# Patient Record
Sex: Male | Born: 1995 | Race: Black or African American | Hispanic: No | Marital: Single | Smoking: Never smoker
Health system: Southern US, Community
[De-identification: ages and names within clinical notes are randomized; demographics above are authoritative.]

## PROBLEM LIST (undated history)

## (undated) DIAGNOSIS — J189 Pneumonia, unspecified organism: Secondary | ICD-10-CM

## (undated) DIAGNOSIS — J45909 Unspecified asthma, uncomplicated: Secondary | ICD-10-CM

## (undated) DIAGNOSIS — D571 Sickle-cell disease without crisis: Secondary | ICD-10-CM

## (undated) DIAGNOSIS — H539 Unspecified visual disturbance: Secondary | ICD-10-CM

## (undated) DIAGNOSIS — J302 Other seasonal allergic rhinitis: Secondary | ICD-10-CM

## (undated) DIAGNOSIS — R51 Headache: Secondary | ICD-10-CM

## (undated) HISTORY — DX: Other seasonal allergic rhinitis: J30.2

---
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
## 2017-01-04 ENCOUNTER — Ambulatory Visit (INDEPENDENT_AMBULATORY_CARE_PROVIDER_SITE_OTHER): Payer: Medicaid Other | Admitting: Family Medicine

## 2017-01-04 ENCOUNTER — Encounter: Payer: Self-pay | Admitting: Family Medicine

## 2017-01-04 VITALS — BP 128/60 | HR 84 | Temp 98.2°F | Resp 16 | Ht 68.0 in | Wt 156.0 lb

## 2017-01-04 DIAGNOSIS — R21 Rash and other nonspecific skin eruption: Secondary | ICD-10-CM

## 2017-01-04 DIAGNOSIS — Z7689 Persons encountering health services in other specified circumstances: Secondary | ICD-10-CM

## 2017-01-04 DIAGNOSIS — Z113 Encounter for screening for infections with a predominantly sexual mode of transmission: Secondary | ICD-10-CM

## 2017-01-04 DIAGNOSIS — D571 Sickle-cell disease without crisis: Secondary | ICD-10-CM | POA: Diagnosis not present

## 2017-01-04 LAB — COMPLETE METABOLIC PANEL WITH GFR
ALT: 25 U/L (ref 9–46)
AST: 34 U/L (ref 10–40)
Albumin: 4.3 g/dL (ref 3.6–5.1)
Alkaline Phosphatase: 94 U/L (ref 40–115)
BUN: 8 mg/dL (ref 7–25)
CO2: 22 mmol/L (ref 20–31)
CREATININE: 0.68 mg/dL (ref 0.60–1.35)
Calcium: 9.4 mg/dL (ref 8.6–10.3)
Chloride: 104 mmol/L (ref 98–110)
GFR, Est African American: >89 mL/min (ref >=60)
GFR, Est Non African American: >89 mL/min (ref >=60)
Glucose, Bld: 91 mg/dL (ref 65–99)
Potassium: 4.2 mmol/L (ref 3.5–5.3)
SODIUM: 137 mmol/L (ref 135–146)
Total Bilirubin: 1.2 mg/dL (ref 0.2–1.2)
Total Protein: 7.7 g/dL (ref 6.1–8.1)

## 2017-01-04 LAB — CBC WITH DIFFERENTIAL/PLATELET
Basophils Absolute: 77 cells/uL (ref 0–200)
Basophils Relative: 1 %
EOS ABS: 385 {cells}/uL (ref 15–500)
Eosinophils Relative: 5 %
HEMATOCRIT: 26.2 % — AB (ref 38.5–50.0)
Hemoglobin: 7.9 g/dL — ABNORMAL LOW (ref 13.2–17.1)
LYMPHS PCT: 47 %
Lymphs Abs: 3619 cells/uL (ref 850–3900)
MCH: 22.6 pg — ABNORMAL LOW (ref 27.0–33.0)
MCHC: 30.2 g/dL — ABNORMAL LOW (ref 32.0–36.0)
MCV: 74.9 fL — ABNORMAL LOW (ref 80.0–100.0)
MPV: 9.2 fL (ref 7.5–12.5)
Monocytes Absolute: 1001 cells/uL — ABNORMAL HIGH (ref 200–950)
Monocytes Relative: 13 %
Neutro Abs: 2618 cells/uL (ref 1500–7800)
Neutrophils Relative %: 34 %
Platelets: 638 10*3/uL — ABNORMAL HIGH (ref 140–400)
RBC: 3.5 MIL/uL — ABNORMAL LOW (ref 4.20–5.80)
RDW: 22.1 % — AB (ref 11.0–15.0)
WBC: 7.7 10*3/uL (ref 3.8–10.8)

## 2017-01-04 LAB — RETICULOCYTES
ABS RETIC: 178500 {cells}/uL — AB (ref 25000–90000)
RBC.: 3.5 MIL/uL — ABNORMAL LOW (ref 4.20–5.80)
Retic Ct Pct: 5.1 %

## 2017-01-04 LAB — POCT URINALYSIS DIP (DEVICE)
Bilirubin Urine: NEGATIVE
Glucose, UA: NEGATIVE mg/dL
HGB URINE DIPSTICK: NEGATIVE
Ketones, ur: NEGATIVE mg/dL
LEUKOCYTES UA: NEGATIVE
NITRITE: NEGATIVE
Protein, ur: NEGATIVE mg/dL
Specific Gravity, Urine: 1.015 (ref 1.005–1.030)
UROBILINOGEN UA: 1 mg/dL (ref 0.0–1.0)
pH: 6.5 (ref 5.0–8.0)

## 2017-01-04 MED ORDER — TRIAMCINOLONE ACETONIDE 0.1 % EX CREA: 1.0000 "application " | TOPICAL_CREAM | Freq: Two times a day (BID) | CUTANEOUS | 0 refills | Status: DC

## 2017-01-04 NOTE — Patient Instructions (Signed)
Apply triamcinolone cream to rash which appears to be eczema. If rash worsens or doesn't improve, return for care.   Sickle Cell Anemia, Adult Sickle cell anemia is a condition where your red blood cells are shaped like sickles. Red blood cells carry oxygen through the body. Sickle-shaped red blood cells do not live as long as normal red blood cells. They also clump together and block blood from flowing through the blood vessels. These things prevent the body from getting enough oxygen. Sickle cell anemia causes organ damage and pain. It also increases the risk of infection. Follow these instructions at home:  Drink enough fluid to keep your pee (urine) clear or pale yellow. Drink more in hot weather and during exercise.  Do not smoke. Smoking lowers oxygen levels in the blood.  Only take over-the-counter or prescription medicines as told by your doctor.  Take antibiotic medicines as told by your doctor. Make sure you finish them even if you start to feel better.  Take supplements as told by your doctor.  Consider wearing a medical alert bracelet. This tells anyone caring for you in an emergency of your condition.  When traveling, keep your medical information, doctors' names, and the medicines you take with you at all times.  If you have a fever, do not take fever medicines right away. This could cover up a problem. Tell your doctor.  Keep all follow-up visits with your doctor. Sickle cell anemia requires regular medical care. Contact a doctor if: You have a fever. Get help right away if:  You feel dizzy or faint.  You have new belly (abdominal) pain, especially on the left side near the stomach area.  You have a lasting, often uncomfortable and painful erection of the penis (priapism). If it is not treated right away, you will become unable to have sex (impotence).  You have numbness in your arms or legs or you have a hard time moving them.  You have a hard time talking.  You  have a fever or lasting symptoms for more than 2-3 days.  You have a fever and your symptoms suddenly get worse.  You have signs or symptoms of infection. These include: ? Chills. ? Being more tired than normal (lethargy). ? Irritability. ? Poor eating. ? Throwing up (vomiting).  You have pain that is not helped with medicine.  You have shortness of breath.  You have pain in your chest.  You are coughing up pus-like or bloody mucus.  You have a stiff neck.  Your feet or hands swell or have pain.  Your belly looks bloated.  Your joints hurt. This information is not intended to replace advice given to you by your health care provider. Make sure you discuss any questions you have with your health care provider. Document Released: 03/22/2013 Document Revised: 11/07/2015 Document Reviewed: 01/11/2013 Elsevier Interactive Patient Education  2017 Elsevier Inc.    Eczema Eczema, also called atopic dermatitis, is a skin disorder that causes inflammation of the skin. It causes a red rash and dry, scaly skin. The skin becomes very itchy. Eczema is generally worse during the cooler winter months and often improves with the warmth of summer. Eczema usually starts showing signs in infancy. Some children outgrow eczema, but it may last through adulthood. What are the causes? The exact cause of eczema is not known, but it appears to run in families. People with eczema often have a family history of eczema, allergies, asthma, or hay fever. Eczema is not contagious.  Flare-ups of the condition may be caused by:  Contact with something you are sensitive or allergic to.  Stress.  What are the signs or symptoms?  Dry, scaly skin.  Red, itchy rash.  Itchiness. This may occur before the skin rash and may be very intense. How is this diagnosed? The diagnosis of eczema is usually made based on symptoms and medical history. How is this treated? Eczema cannot be cured, but symptoms usually  can be controlled with treatment and other strategies. A treatment plan might include:  Controlling the itching and scratching. ? Use over-the-counter antihistamines as directed for itching. This is especially useful at night when the itching tends to be worse. ? Use over-the-counter steroid creams as directed for itching. ? Avoid scratching. Scratching makes the rash and itching worse. It may also result in a skin infection (impetigo) due to a break in the skin caused by scratching.  Keeping the skin well moisturized with creams every day. This will seal in moisture and help prevent dryness. Lotions that contain alcohol and water should be avoided because they can dry the skin.  Limiting exposure to things that you are sensitive or allergic to (allergens).  Recognizing situations that cause stress.  Developing a plan to manage stress.  Follow these instructions at home:  Only take over-the-counter or prescription medicines as directed by your health care provider.  Do not use anything on the skin without checking with your health care provider.  Keep baths or showers short (5 minutes) in warm (not hot) water. Use mild cleansers for bathing. These should be unscented. You may add nonperfumed bath oil to the bath water. It is best to avoid soap and bubble bath.  Immediately after a bath or shower, when the skin is still damp, apply a moisturizing ointment to the entire body. This ointment should be a petroleum ointment. This will seal in moisture and help prevent dryness. The thicker the ointment, the better. These should be unscented.  Keep fingernails cut short. Children with eczema may need to wear soft gloves or mittens at night after applying an ointment.  Dress in clothes made of cotton or cotton blends. Dress lightly, because heat increases itching.  A child with eczema should stay away from anyone with fever blisters or cold sores. The virus that causes fever blisters (herpes  simplex) can cause a serious skin infection in children with eczema. Contact a health care provider if:  Your itching interferes with sleep.  Your rash gets worse or is not better within 1 week after starting treatment.  You see pus or soft yellow scabs in the rash area.  You have a fever.  You have a rash flare-up after contact with someone who has fever blisters. This information is not intended to replace advice given to you by your health care provider. Make sure you discuss any questions you have with your health care provider. Document Released: 05/29/2000 Document Revised: 11/07/2015 Document Reviewed: 01/02/2013 Elsevier Interactive Patient Education  2017 [employer]

## 2017-01-04 NOTE — Progress Notes (Signed)
Patient ID: Derrick Wu, male    DOB: 1996-03-04, 21 y.o.   MRN: [id number]  PCP: Derrick NeighborsHarris, Shanetta Nicolls S, FNP  Chief Complaint  Patient presents with  . Establish Care  . Rash    On both legs    Subjective:  HPI Derrick Wu is a [age] y.o. male presents for evaluation of bilateral rash of both legs. Medical problems: Sickle Cell Disease and Asthma  Derrick Wu was [facility] the [facility] by the [facility]. 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 He doesn't take any chronic pain medication and reports that he almost never experiences any form of pain crisis. He takes ibuprofen for generalized pain when occurs and reports relief with this regimen at present. He is followed at [facility] and reports that his last visit was little over 1 year ago. His baseline hemoglobin averages 7.0-7.9. He is current with his immunizations. His only complaint today is a  rash on the top of his knees bilaterally. The rash originally developed over a few months ago was treated with hydrocortisone cream. The rash improved although never completely went away. Rash is dry, patchy, with lighten skin color. The rash does not itch.  He denies any recent changes in detergents, foods, soaps, or lotion.  Derrick Wu requests STD testing today. He is sexually active, however denies any genital lesions, dysuria, or penile discharge. Social History   Social History  . Marital status: Single    Spouse name: N/A  . Number of children: N/A  . Years of education: N/A   Occupational History  . Not on file.   Social History Main Topics  . Smoking status: Never Smoker  . Smokeless tobacco: Never Used  . Alcohol use No  . Drug use: No  . Sexual activity: No   Other Topics Concern  . Not on file   Social History Narrative   Pt is in 11th grade 2014-15    Family History  Problem Relation Age of Onset  . Diabetes Mother   . Hypertension Mother   . Vision loss  Mother   . Diabetes Father   . Hypertension Father   . Vision loss Father    Review of Systems  See HPI  Prior to Admission medications   Medication Sig Start Date End Date Taking? Authorizing Provider  ibuprofen (ADVIL,MOTRIN) 400 MG tablet Take 1 tablet (400 mg total) by mouth every 6 (six) hours as needed. 05/12/13  Yes Kirichenko, Tatyana, PA-C  albuterol (PROVENTIL HFA;VENTOLIN HFA) 108 (90 BASE) MCG/ACT inhaler Inhale 1-2 puffs into the lungs every 6 (six) hours as needed for wheezing or shortness of breath. Patient not taking: Reported on 01/04/2017 04/16/14   Derrick MoGentry, Matthew, MD  guaifenesin (ROBITUSSIN) 100 MG/5ML syrup Take 5-10 mLs (100-200 mg total) by mouth every 4 (four) hours as needed for cough. Patient not taking: Reported on 01/04/2017 05/12/13   Derrick CrumbleKirichenko, Tatyana, PA-C  HYDROcodone-acetaminophen (NORCO) 5-325 MG per tablet Take 1 tablet by mouth every 6 (six) hours as needed for moderate pain. Patient not taking: Reported on 01/04/2017 05/26/13   Derrick Wu, DDS  sodium chloride (OCEAN) 0.65 % SOLN nasal spray Place 1 spray into both nostrils as needed for congestion. Patient not taking: Reported on 01/04/2017 05/12/13   Derrick CrumbleKirichenko, Tatyana, PA-C    Past Medical, Surgical Family and Social History reviewed and updated.    Objective:   Today'Wu Vitals   01/04/17 1410  BP: 128/60  Pulse: 84  Resp: 16  Temp: 98.2 F (36.8 C)  TempSrc: Oral  SpO2: 96%  Weight: 156 lb (70.8 kg)  Height: 5\' 8"  (1.727 m)    Wt Readings from Last 3 Encounters:  01/04/17 156 lb (70.8 kg)  04/16/14 138 lb 0.1 oz (62.6 kg) (37 %, Z= -0.34)*  05/26/13 118 lb 13.3 oz (53.9 kg) (15 %, Z= -1.04)*   * Growth percentiles are based on CDC 2-20 Years data.   Physical Exam  Constitutional: He is oriented to person, place, and time. He appears well-developed and well-nourished.  HENT:  Head: Normocephalic and atraumatic.  Right Ear: External ear normal.  Left Ear: External ear normal.   Nose: Nose normal.  Mouth/Throat: Oropharynx is clear and moist.  Eyes: Pupils are equal, round, and reactive to light. Conjunctivae are normal. No scleral icterus.  Neck: Normal range of motion. Neck supple.  Cardiovascular: Normal rate, regular rhythm, normal heart sounds and intact distal pulses.   Pulmonary/Chest: Effort normal and breath sounds normal.  Abdominal: Soft. Bowel sounds are normal.  Neurological: He is alert and oriented to person, place, and time.  Skin: Skin is warm and dry. Rash noted.  Fine,  Macular, dry, scaly rash bilateral lower thigh above the knees.  Psychiatric: He has a normal mood and affect. His behavior is normal. Judgment and thought content normal.   Assessment & Plan:  1. Hb-SS disease without crisis (HCC), stable - COMPLETE METABOLIC PANEL WITH GFR - CBC with Differential - Reticulocytes -Schedule a follow-up with [facility] for routine annual follow-up.  2. Encounter to establish care   3. Routine screening for STI (sexually transmitted infection) - HIV antibody (with reflex) - RPR - HSV(herpes simplex vrs) 1+2 ab-IgG - GC/Chlamydia Probe Amp  4. Rash and nonspecific skin eruption -Apply triamcinolone cream to affected area, twice daily   RTC: 3 months for routine wellness evaluation, influenza vaccine, and any other outstanding health maintenance items.  Godfrey Pick. Tiburcio Pea, MSN, FNP-C [facility]  [address] [place], [place] [address] [phone]

## 2017-01-05 LAB — HSV(HERPES SIMPLEX VRS) I + II AB-IGG
HSV 1 Glycoprotein G Ab, IgG: 21.1 Index — ABNORMAL HIGH (ref <0.90)
HSV 2 Glycoprotein G Ab, IgG: <0.9 Index (ref <0.90)

## 2017-01-05 LAB — HIV ANTIBODY (ROUTINE TESTING W REFLEX): HIV 1&2 Ab, 4th Generation: NONREACTIVE

## 2017-01-05 LAB — RPR

## 2017-01-06 ENCOUNTER — Telehealth: Payer: Self-pay

## 2017-01-06 LAB — GC/CHLAMYDIA PROBE AMP

## 2017-01-07 DIAGNOSIS — D571 Sickle-cell disease without crisis: Secondary | ICD-10-CM | POA: Insufficient documentation

## 2017-01-07 DIAGNOSIS — R21 Rash and other nonspecific skin eruption: Secondary | ICD-10-CM | POA: Insufficient documentation

## 2017-03-16 ENCOUNTER — Ambulatory Visit: Payer: Medicaid Other | Admitting: Family Medicine

## 2017-09-03 NOTE — Telephone Encounter (Signed)
Message not needed. °

## 2018-05-30 ENCOUNTER — Ambulatory Visit (INDEPENDENT_AMBULATORY_CARE_PROVIDER_SITE_OTHER): Payer: Medicaid Other | Admitting: Family Medicine

## 2018-05-30 ENCOUNTER — Encounter: Payer: Self-pay | Admitting: Family Medicine

## 2018-05-30 ENCOUNTER — Ambulatory Visit (HOSPITAL_COMMUNITY)
Admission: RE | Admit: 2018-05-30 | Discharge: 2018-05-30 | Disposition: A | Discharge status: Home / Self Care | Payer: Medicaid Other | Source: Ambulatory Visit | Attending: Family Medicine | Admitting: Family Medicine

## 2018-05-30 VITALS — BP 124/88 | HR 84 | Ht 68.0 in | Wt 152.0 lb

## 2018-05-30 DIAGNOSIS — S6991XA Unspecified injury of right wrist, hand and finger(s), initial encounter: Secondary | ICD-10-CM | POA: Diagnosis not present

## 2018-05-30 DIAGNOSIS — N390 Urinary tract infection, site not specified: Secondary | ICD-10-CM

## 2018-05-30 DIAGNOSIS — D571 Sickle-cell disease without crisis: Secondary | ICD-10-CM | POA: Diagnosis not present

## 2018-05-30 DIAGNOSIS — Z113 Encounter for screening for infections with a predominantly sexual mode of transmission: Secondary | ICD-10-CM

## 2018-05-30 DIAGNOSIS — Z09 Encounter for follow-up examination after completed treatment for conditions other than malignant neoplasm: Secondary | ICD-10-CM

## 2018-05-30 DIAGNOSIS — Z7689 Persons encountering health services in other specified circumstances: Secondary | ICD-10-CM | POA: Diagnosis not present

## 2018-05-30 DIAGNOSIS — Z0000 Encounter for general adult medical examination without abnormal findings: Secondary | ICD-10-CM

## 2018-05-30 LAB — POCT URINALYSIS DIP (MANUAL ENTRY)
Bilirubin, UA: NEGATIVE
Blood, UA: NEGATIVE
Glucose, UA: NEGATIVE mg/dL
Ketones, POC UA: NEGATIVE mg/dL
Nitrite, UA: NEGATIVE
Protein Ur, POC: NEGATIVE mg/dL
Spec Grav, UA: 1.015 (ref 1.010–1.025)
Urobilinogen, UA: 1 E.U./dL
pH, UA: 6 (ref 5.0–8.0)

## 2018-05-30 MED ORDER — SULFAMETHOXAZOLE-TRIMETHOPRIM 800-160 MG PO TABS: 1.0000 | ORAL_TABLET | Freq: Two times a day (BID) | ORAL | 0 refills | Status: DC

## 2018-05-30 NOTE — Progress Notes (Signed)
Sickle Cell Anemia--Re-establish Care  Subjective:    Patient ID: Derrick Wu, male    DOB: [birth year], 22 y.o.   MRN: [id number]  Chief Complaint  Patient presents with  . Follow-up    Sickle cell  . Hand Pain    right   HPI  Derrick Wu is a 22 year old male with a past medical history of Sickle Cell Anemia, Pneumonia, Headaches, and Asthma. He is here today to re-establish care.   Current Status: Since his last office visit, he is doing well with no complaints. He states that he has pain in his feet, hands, and back. He rates his pain today at 0/10. He has not has a hospital visit for Sickle Cell Crisis since 06/27/2017 where he was treated and discharged the same day. He is currently taking all medications as prescribed and staying well hydrated. He is only taking Motrin 800 mg for pain. He reports occasional dizziness and headaches. He is followed by Hematologist at [facility]. He had a recent right hand injury by hitting his hand on a wall a week ago.   He denies fevers, chills, fatigue, recent infections, weight loss, and night sweats. He has not had any visual changes, and falls. No chest pain, heart palpitations, cough and shortness of breath reported. No reports of GI problems such as nausea, vomiting, diarrhea, and constipation. He has no reports of blood in stools, dysuria and hematuria. No depression or anxiety reported. He denies pain today.   Review of Systems  Constitutional: Negative.   HENT: Negative.   Eyes: Negative.   Respiratory: Negative.   Cardiovascular: Negative.   Gastrointestinal: Negative.   Endocrine: Negative.   Genitourinary: Negative.   Musculoskeletal: Negative.   Skin: Negative.   Allergic/Immunologic: Negative.   Neurological: Positive for dizziness and headaches.  Hematological: Negative.   Psychiatric/Behavioral: Negative.    Objective:   Physical Exam Vitals signs and nursing note reviewed.  Constitutional:      Appearance: Normal  appearance. He is normal weight.  HENT:     Head: Normocephalic and atraumatic.     Right Ear: Tympanic membrane, ear canal and external ear normal.     Left Ear: Tympanic membrane, ear canal and external ear normal.     Nose: Nose normal.     Mouth/Throat:     Mouth: Mucous membranes are moist.     Pharynx: Oropharynx is clear.  Eyes:     Conjunctiva/sclera: Conjunctivae normal.     Pupils: Pupils are equal, round, and reactive to light.  Neck:     Musculoskeletal: Normal range of motion and neck supple.  Cardiovascular:     Rate and Rhythm: Normal rate and regular rhythm.     Pulses: Normal pulses.     Heart sounds: Normal heart sounds.  Pulmonary:     Effort: Pulmonary effort is normal.     Breath sounds: Normal breath sounds.  Abdominal:     General: Abdomen is flat. Bowel sounds are normal.     Palpations: Abdomen is soft.  Musculoskeletal: Normal range of motion.  Skin:    General: Skin is warm and dry.  Neurological:     Mental Status: He is alert.  Psychiatric:        Mood and Affect: Mood normal.        Behavior: Behavior normal.        Thought Content: Thought content normal.        Judgment: Judgment normal.    Assessment &  Plan:   1. Encounter to establish care  2. Hb-SS disease without crisis [facility](HCC) He is doing well today. He will continue to take pain medications as prescribed; will continue to avoid extreme heat and cold; will continue to eat a healthy diet and drink at least 64 ounces of water daily; continue stool softener as needed; will avoid colds and flu; will continue to get plenty of sleep and rest; will continue to avoid high stressful situations and remain infection free; will continue Folic Acid 1 mg daily to avoid sickle cell crisis.   3. Hand injury, right, initial encounter We will get X-ray today.  - DG Hand Complete Right; Future  4. Routine screening for STI (sexually transmitted infection)  5. Healthcare maintenance - STD Screen (8) -  CBC with Differential - Comprehensive metabolic panel - Hemoglobinopathy evaluation  6. UTI We will initiate Septra today.   7. Follow up He will follow up in 3 months.  - POCT urinalysis dipstick  No orders of the defined types were placed in this encounter.   Derrick IpNatalie Kenshawn Maciolek,  MSN, FNP-C [facility] [address]  [place], [place] [address] [phone]

## 2018-05-31 ENCOUNTER — Other Ambulatory Visit: Payer: Self-pay | Admitting: Family Medicine

## 2018-05-31 DIAGNOSIS — S6291XA Unspecified fracture of right wrist and hand, initial encounter for closed fracture: Secondary | ICD-10-CM

## 2018-05-31 NOTE — Progress Notes (Signed)
Referral sent to Orthopedic surgery today. Patient is aware.

## 2018-06-02 ENCOUNTER — Telehealth: Payer: Self-pay

## 2018-06-02 NOTE — Telephone Encounter (Signed)
-----   Message from Kallie LocksNatalie M Stroud, FNP sent at 05/30/2018  4:35 PM EST ----- Regarding: "New Rx" Derrick Wu,   Please inform patient that we have sent Rx for Septra to pharmacy today. Urinalysis revealed bacteria. He is to take medication as directed. He is to complete all medication. Keep follow up appointment as scheduled.   Thank you.

## 2018-06-02 NOTE — Telephone Encounter (Signed)
Patient notified and will pick up medication  

## 2018-06-03 ENCOUNTER — Ambulatory Visit (INDEPENDENT_AMBULATORY_CARE_PROVIDER_SITE_OTHER): Payer: Medicaid Other | Admitting: Orthopaedic Surgery

## 2018-06-03 ENCOUNTER — Ambulatory Visit (INDEPENDENT_AMBULATORY_CARE_PROVIDER_SITE_OTHER): Payer: Self-pay | Admitting: Orthopaedic Surgery

## 2018-06-03 ENCOUNTER — Encounter (INDEPENDENT_AMBULATORY_CARE_PROVIDER_SITE_OTHER): Payer: Self-pay | Admitting: Orthopaedic Surgery

## 2018-06-03 VITALS — BP 116/55 | HR 89 | Ht 72.0 in | Wt 156.0 lb

## 2018-06-03 DIAGNOSIS — M79641 Pain in right hand: Secondary | ICD-10-CM | POA: Diagnosis not present

## 2018-06-03 DIAGNOSIS — D571 Sickle-cell disease without crisis: Secondary | ICD-10-CM

## 2018-06-03 LAB — CBC WITH DIFFERENTIAL/PLATELET
Basophils Absolute: 0.2 10*3/uL (ref 0.0–0.2)
Basos: 2 %
EOS (ABSOLUTE): 0.4 10*3/uL (ref 0.0–0.4)
Eos: 4 %
Hematocrit: 26 % — ABNORMAL LOW (ref 37.5–51.0)
Hemoglobin: 8.3 g/dL — ABNORMAL LOW (ref 13.0–17.7)
Immature Grans (Abs): 0 10*3/uL (ref 0.0–0.1)
Immature Granulocytes: 0 %
Lymphocytes Absolute: 4.6 10*3/uL — ABNORMAL HIGH (ref 0.7–3.1)
Lymphs: 46 %
MCH: 24.3 pg — ABNORMAL LOW (ref 26.6–33.0)
MCHC: 31.9 g/dL (ref 31.5–35.7)
MCV: 76 fL — ABNORMAL LOW (ref 79–97)
Monocytes Absolute: 1 10*3/uL — ABNORMAL HIGH (ref 0.1–0.9)
Monocytes: 10 %
Neutrophils Absolute: 3.7 10*3/uL (ref 1.4–7.0)
Neutrophils: 38 %
Platelets: 561 10*3/uL — ABNORMAL HIGH (ref 150–450)
RBC: 3.42 x10E6/uL — ABNORMAL LOW (ref 4.14–5.80)
RDW: 22.6 % — ABNORMAL HIGH (ref 12.3–15.4)
WBC: 10 10*3/uL (ref 3.4–10.8)

## 2018-06-03 LAB — HEMOGLOBINOPATHY EVALUATION
HGB C: 0 %
HGB S: 94.4 % — ABNORMAL HIGH
HGB VARIANT: 0 %
Hemoglobin A2 Quantitation: 4.8 % — ABNORMAL HIGH (ref 1.8–3.2)
Hemoglobin F Quantitation: 0.8 % (ref 0.0–2.0)
Hgb A: 0 % — ABNORMAL LOW (ref 96.4–98.8)

## 2018-06-03 LAB — STD SCREEN (8)
HIV Screen 4th Generation wRfx: NONREACTIVE
HSV 1 Glycoprotein G Ab, IgG: 23.5 index — ABNORMAL HIGH (ref 0.00–0.90)
HSV 2 IgG, Type Spec: <0.91 index (ref 0.00–0.90)
Hep A IgM: NEGATIVE
Hep B C IgM: NEGATIVE
Hep C Virus Ab: <0.1 s/co ratio (ref 0.0–0.9)
Hepatitis B Surface Ag: NEGATIVE
RPR Ser Ql: NONREACTIVE

## 2018-06-03 LAB — COMPREHENSIVE METABOLIC PANEL
ALT: 35 IU/L (ref 0–44)
AST: 40 IU/L (ref 0–40)
Albumin/Globulin Ratio: 1.3 (ref 1.2–2.2)
Albumin: 4.5 g/dL (ref 3.5–5.5)
Alkaline Phosphatase: 110 IU/L (ref 39–117)
BUN/Creatinine Ratio: 7 — ABNORMAL LOW (ref 9–20)
BUN: 5 mg/dL — ABNORMAL LOW (ref 6–20)
Bilirubin Total: 1.3 mg/dL — ABNORMAL HIGH (ref 0.0–1.2)
CO2: 22 mmol/L (ref 20–29)
Calcium: 9.7 mg/dL (ref 8.7–10.2)
Chloride: 102 mmol/L (ref 96–106)
Creatinine, Ser: 0.67 mg/dL — ABNORMAL LOW (ref 0.76–1.27)
GFR calc Af Amer: 159 mL/min/{1.73_m2} (ref >59)
GFR calc non Af Amer: 137 mL/min/{1.73_m2} (ref >59)
Globulin, Total: 3.4 g/dL (ref 1.5–4.5)
Glucose: 84 mg/dL (ref 65–99)
Potassium: 4.7 mmol/L (ref 3.5–5.2)
Sodium: 139 mmol/L (ref 134–144)
Total Protein: 7.9 g/dL (ref 6.0–8.5)

## 2018-06-03 NOTE — Progress Notes (Signed)
Office Visit Note   Patient: Derrick Wu           Date of Birth: May 18, 1996           MRN: [id number] Visit Date: 06/03/2018              Requested by: Kallie LocksStroud, Natalie M, FNP [address] [place], [place] [address] PCP: Kallie LocksStroud, Natalie M, FNP   Assessment & Plan: Visit Diagnoses:  1. Pain of right hand     Plan: Boxer's fracture right hand with abundant callus approximately 613 weeks old.  Have discussed treatment options.  At this point will just work on range of motion and I think he will do well over time.  Also having bilateral foot pain that I think is a combination of plantar fasciitis and pronated feet.  Will apply full arch supports. office 3 to 4 weeks  Follow-Up Instructions: Return if symptoms worsen or fail to improve.   Orders:  No orders of the defined types were placed in this encounter.  No orders of the defined types were placed in this encounter.     Procedures: No procedures performed   Clinical Data: No additional findings.   Subjective: Chief Complaint  Patient presents with  . Right Hand - Pain  . Hand Pain    hit the wall the 3 weeks  ,when to urgent care was today that her fx his hand ,using ice pck, still having pain   22 year old young man hit a wall with his right dominant hand while making a fist approximately 3 weeks ago.  Still having some swelling in the area of the fifth metacarpal with occasional pain.  Seen by his primary care physician on 1216 with x-rays demonstrating a displaced boxer's fracture of the fifth metatarsal.  I reviewed these on the PACS system.  There is about a 30 degree angulation with abundant callus HPI  Review of Systems  Musculoskeletal: Positive for joint swelling.  Neurological: Positive for weakness.     Objective: Vital Signs: BP (!) 116/55   Pulse 89   Ht 6' (1.829 m)   Wt 156 lb (70.8 kg)   BMI 21.16 kg/m   Physical Exam Constitutional:      Appearance: He is well-developed.  Eyes:       Pupils: Pupils are equal, round, and reactive to light.  Pulmonary:     Effort: Pulmonary effort is normal.  Skin:    General: Skin is warm and dry.  Neurological:     Mental Status: He is alert and oriented to person, place, and time.  Psychiatric:        Behavior: Behavior normal.     Ortho Exam examination of the right hand reveals some prominence of the distal fifth metatarsal.  There is mild tenderness.  Neurovascular exam intact.  Lacks a half a fingerbreadth actively from touching the palm of his hand was little finger but passively I can place it in a normal position.  He has a very minimal extensor lag based on the fracture and some callus forming in the dorsum of his hand.  Minimal tenderness.  Skin intact.  Neurovascular exam intact.  Pronation with both of his feet.  Mild tenderness over the plantar aspect of the os calcis.  No pain along the ankle.  Specialty Comments:  No specialty comments available.  Imaging: No results found.   PMFS History: Patient Active Problem List   Diagnosis Date Noted  . Sickle cell anemia (  HCC) 01/07/2017  . Rash and nonspecific skin eruption 01/07/2017   Past Medical History:  Diagnosis Date  . Asthma   . Headache(784.0)   . Pneumonia   . Sickle cell anemia (HCC)   . Vision abnormalities    Hx: of wears glasses    Family History  Problem Relation Age of Onset  . Diabetes Mother   . Hypertension Mother   . Vision loss Mother   . Diabetes Father   . Hypertension Father   . Vision loss Father     Past Surgical History:  Procedure Laterality Date  . TOOTH EXTRACTION N/A 05/26/2013   Procedure: EXTRACTION WISDOM TEETH - one, sixteen, seventeen and [age].;  Surgeon: Georgia LopesScott M Jensen, DDS;  Location: [facility] OR;  Service: Oral Surgery;  Laterality: N/A;   Social History   Occupational History  . Not on file  Tobacco Use  . Smoking status: Never Smoker  . Smokeless tobacco: Never Used  Substance and Sexual Activity  .  Alcohol use: No  . Drug use: No  . Sexual activity: Never

## 2018-08-29 ENCOUNTER — Ambulatory Visit: Payer: Medicaid Other | Admitting: Family Medicine

## 2018-09-04 ENCOUNTER — Telehealth: Payer: Self-pay

## 2018-09-04 NOTE — Telephone Encounter (Signed)
Patient telephoned regarding their 1040 follow up appointment on 09/05/2018. Phone number not working at this time.

## 2018-09-05 ENCOUNTER — Other Ambulatory Visit: Payer: Self-pay

## 2018-09-05 ENCOUNTER — Ambulatory Visit (INDEPENDENT_AMBULATORY_CARE_PROVIDER_SITE_OTHER): Payer: Medicaid Other | Admitting: Family Medicine

## 2018-09-05 ENCOUNTER — Encounter: Payer: Self-pay | Admitting: Family Medicine

## 2018-09-05 VITALS — BP 122/60 | HR 86 | Temp 98.0°F | Ht 72.0 in | Wt 160.0 lb

## 2018-09-05 DIAGNOSIS — G894 Chronic pain syndrome: Secondary | ICD-10-CM | POA: Diagnosis not present

## 2018-09-05 DIAGNOSIS — J302 Other seasonal allergic rhinitis: Secondary | ICD-10-CM | POA: Diagnosis not present

## 2018-09-05 DIAGNOSIS — Z09 Encounter for follow-up examination after completed treatment for conditions other than malignant neoplasm: Secondary | ICD-10-CM

## 2018-09-05 DIAGNOSIS — D571 Sickle-cell disease without crisis: Secondary | ICD-10-CM

## 2018-09-05 LAB — POCT URINALYSIS DIP (MANUAL ENTRY)
Bilirubin, UA: NEGATIVE
Glucose, UA: NEGATIVE mg/dL
Ketones, POC UA: NEGATIVE mg/dL
Leukocytes, UA: NEGATIVE
Nitrite, UA: NEGATIVE
Protein Ur, POC: 100 mg/dL — AB
Spec Grav, UA: 1.015 (ref 1.010–1.025)
Urobilinogen, UA: 1 E.U./dL
pH, UA: 7 (ref 5.0–8.0)

## 2018-09-05 MED ORDER — FOLIC ACID 1 MG PO TABS: 1.0000 mg | ORAL_TABLET | Freq: Every day | ORAL | 6 refills | Status: DC

## 2018-09-05 MED ORDER — CETIRIZINE HCL 10 MG PO TABS: 10.0000 mg | ORAL_TABLET | Freq: Every day | ORAL | 11 refills | Status: DC

## 2018-09-05 NOTE — Progress Notes (Signed)
[facility] Sickle Cell Anemia Follow Up  Subjective:  Patient ID: Derrick Wu, male    DOB: Oct 04, 1995  Age: 23 y.o. MRN: [id number]  CC:  Chief Complaint  Patient presents with  . Follow-up    Sickle cell     HPI Derrick Wu is a 23 year old male who presents for Sickle Cell Anemia follow up today.   Past Medical History:  Diagnosis Date  . Asthma   . Headache(784.0)   . Pneumonia   . Sickle cell anemia (HCC)   . Vision abnormalities    Hx: of wears glasses    Current Status: Since his last office visit, he is doing well with no complaints. He states that he has pain in his feet and hands. He rates his pain today at 0/10. He has not has a hospital visit for Sickle Cell Crisis since 06/27/2017 where he was treated and discharged the same day. He continues to take Motrin 800 mg as needed for pain. He states that he had began to develop allergies recently. He is currently taking all medications as prescribed and staying well hydrated. He reports occasional nausea, dizziness and headaches. He is currently a Holiday representative at [employer].   He denies fevers, chills, fatigue, recent infections, weight loss, and night sweats. He has not had any visual changes, and falls. No chest pain, heart palpitations, cough and shortness of breath reported. No reports of GI problems such as  diarrhea, and constipation. He has no reports of blood in stools, dysuria and hematuria. No depression or anxiety reported. He denies pain today.   Past Surgical History:  Procedure Laterality Date  . TOOTH EXTRACTION N/A 05/26/2013   Procedure: EXTRACTION WISDOM TEETH - one, sixteen, seventeen and thirtytwo.;  Surgeon: Georgia Lopes, DDS;  Location: [facility] OR;  Service: Oral Surgery;  Laterality: N/A;    Family History  Problem Relation Age of Onset  . Diabetes Mother   . Hypertension Mother   . Vision loss Mother   . Diabetes Father    . Hypertension Father   . Vision loss Father     Social History   Socioeconomic History  . Marital status: Single    Spouse name: Not on file  . Number of children: Not on file  . Years of education: Not on file  . Highest education level: Not on file  Occupational History  . Not on file  Social Needs  . Financial resource strain: Not on file  . Food insecurity:    Worry: Not on file    Inability: Not on file  . Transportation needs:    Medical: Not on file    Non-medical: Not on file  Tobacco Use  . Smoking status: Never Smoker  . Smokeless tobacco: Never Used  Substance and Sexual Activity  . Alcohol use: No  . Drug use: No  . Sexual activity: Never  Lifestyle  . Physical activity:    Days per week: Not on file    Minutes per session: Not on file  . Stress: Not on file  Relationships  . Social connections:    Talks on phone: Not on file    Gets together: Not on file    Attends religious service: Not on file    Active member of club or organization: Not on file    Attends meetings of clubs or organizations: Not on file    Relationship status: Not on  file  . Intimate partner violence:    Fear of current or ex partner: Not on file    Emotionally abused: Not on file    Physically abused: Not on file    Forced sexual activity: Not on file  Other Topics Concern  . Not on file  Social History Narrative   Pt is in 11th grade 2014-15    Outpatient Medications Prior to Visit  Medication Sig Dispense Refill  . ibuprofen (ADVIL,MOTRIN) 800 MG tablet Take 800 mg by mouth every 8 (eight) hours as needed.     No facility-administered medications prior to visit.     No Known Allergies  ROS Review of Systems  Constitutional: Negative.   HENT: Negative.   Eyes: Negative.   Respiratory: Negative.   Cardiovascular: Negative.   Gastrointestinal: Positive for nausea.  Endocrine: Negative.   Genitourinary: Negative.   Musculoskeletal: Negative.   Skin: Negative.    Allergic/Immunologic: Negative.   Neurological: Positive for dizziness and headaches.  Hematological: Negative.   Psychiatric/Behavioral: Negative.    Objective:    Physical Exam  Constitutional: He is oriented to person, place, and time. He appears well-developed and well-nourished.  HENT:  Head: Normocephalic and atraumatic.  Eyes: Conjunctivae are normal.  Neck: Normal range of motion. Neck supple.  Cardiovascular: Normal rate, regular rhythm, normal heart sounds and intact distal pulses.  Pulmonary/Chest: Effort normal and breath sounds normal.  Abdominal: Soft. Bowel sounds are normal.  Musculoskeletal: Normal range of motion.  Neurological: He is alert and oriented to person, place, and time. He has normal reflexes.  Skin: Skin is warm and dry.  Psychiatric: He has a normal mood and affect. His behavior is normal. Judgment and thought content normal.  Nursing note and vitals reviewed.   BP 122/60 (BP Location: Left Arm, Patient Position: Sitting, Cuff Size: Small)   Pulse 86   Temp 98 F (36.7 C) (Oral)   Ht 6' (1.829 m)   Wt 160 lb (72.6 kg)   SpO2 97%   BMI 21.70 kg/m  Wt Readings from Last 3 Encounters:  09/05/18 160 lb (72.6 kg)  06/03/18 156 lb (70.8 kg)  05/30/18 152 lb (68.9 kg)     Health Maintenance Due  Topic Date Due  . TETANUS/TDAP  10/06/2015  . INFLUENZA VACCINE  01/13/2018    There are no preventive care reminders to display for this patient.  No results found for: TSH Lab Results  Component Value Date   WBC 10.0 05/30/2018   HGB 8.3 (L) 05/30/2018   HCT 26.0 (L) 05/30/2018   MCV 76 (L) 05/30/2018   PLT 561 (H) 05/30/2018   Lab Results  Component Value Date   NA 139 05/30/2018   K 4.7 05/30/2018   CO2 22 05/30/2018   GLUCOSE 84 05/30/2018   BUN 5 (L) 05/30/2018   CREATININE 0.67 (L) 05/30/2018   BILITOT 1.3 (H) 05/30/2018   ALKPHOS 110 05/30/2018   AST 40 05/30/2018   ALT 35 05/30/2018   PROT 7.9 05/30/2018   ALBUMIN 4.5  05/30/2018   CALCIUM 9.7 05/30/2018   No results found for: CHOL No results found for: HDL No results found for: LDLCALC No results found for: TRIG No results found for: CHOLHDL No results found for: EGBT5V    Assessment & Plan:   1. Hb-SS disease without crisis [facility]) He is doing well today. He will continue to take pain medications as prescribed; will continue to avoid extreme heat and cold; will continue to  eat a healthy diet and drink at least 64 ounces of water daily; continue stool softener as needed; will avoid colds and flu; will continue to get plenty of sleep and rest; will continue to avoid high stressful situations and remain infection free; will continue Folic Acid 1 mg daily to avoid sickle cell crisis.  - folic acid (FOLVITE) 1 MG tablet; Take 1 tablet (1 mg total) by mouth daily.  Dispense: 30 tablet; Refill: 6  2. Chronic pain syndrome  3. Seasonal allergies We will initiate Zyrtec today.  - cetirizine (ZYRTEC) 10 MG tablet; Take 1 tablet (10 mg total) by mouth daily.  Dispense: 30 tablet; Refill: 11  4. Follow up He will follow up in 2 months.  - POCT urinalysis dipstick  Meds ordered this encounter  Medications  . cetirizine (ZYRTEC) 10 MG tablet    Sig: Take 1 tablet (10 mg total) by mouth daily.    Dispense:  30 tablet    Refill:  11  . folic acid (FOLVITE) 1 MG tablet    Sig: Take 1 tablet (1 mg total) by mouth daily.    Dispense:  30 tablet    Refill:  6    Orders Placed This Encounter  Procedures  . POCT urinalysis dipstick    Referral Orders  No referral(s) requested today    Raliegh Ip,  MSN, FNP-C [facility] [address] [place], [place] [address] [phone]   Problem List Items Addressed This Visit      Other   Sickle cell anemia (HCC) - Primary   Relevant Medications   folic acid (FOLVITE) 1 MG tablet    Other Visit Diagnoses    Chronic pain syndrome       Relevant Medications    ibuprofen (ADVIL,MOTRIN) 800 MG tablet   Seasonal allergies       Relevant Medications   cetirizine (ZYRTEC) 10 MG tablet   Follow up       Relevant Orders   POCT urinalysis dipstick (Completed)      Meds ordered this encounter  Medications  . cetirizine (ZYRTEC) 10 MG tablet    Sig: Take 1 tablet (10 mg total) by mouth daily.    Dispense:  30 tablet    Refill:  11  . folic acid (FOLVITE) 1 MG tablet    Sig: Take 1 tablet (1 mg total) by mouth daily.    Dispense:  30 tablet    Refill:  6    Follow-up: Return in about 2 months (around 11/05/2018).    Kallie Locks, FNP

## 2018-09-05 NOTE — Patient Instructions (Signed)
Allergies, Adult An allergy means that your body reacts to something that bothers it (allergen). It is not a normal reaction. This can happen from something that you:  Eat.  Breathe in.  Touch. You can have an allergy (be allergic) to:  Outdoor things, like: ? Pollen. ? Grass. ? Weeds.  Indoor things, like: ? Dust. ? Smoke. ? Pet dander.  Foods.  Medicines.  Things that bother your skin, like: ? Detergents. ? Chemicals. ? Latex.  Perfume.  Bugs. An allergy cannot spread from person to person (is not contagious). Follow these instructions at home:         Stay away from things that you know you are allergic to.  If you have allergies to things in the air, wash out your nose each day. Do it with one of these: ? A salt-water (saline) spray. ? A container (neti pot).  Take over-the-counter and prescription medicines only as told by your doctor.  Keep all follow-up visits as told by your doctor. This is important.  If you are at risk for a very bad allergy reaction (anaphylaxis), keep an auto-injector with you all the time. This is called an epinephrine injection. ? This is pre-measured medicine with a needle. You can put it into your skin by yourself. ? Right after you have a very bad allergy reaction, you or a person with you must give the medicine in less than a few minutes. This is an emergency.  If you have ever had a very bad allergy reaction, wear a medical alert bracelet or necklace. Your very bad allergy should be written on it. Contact a health care provider if:  Your symptoms do not get better with treatment. Get help right away if:  You have symptoms of a very bad allergy reaction. These include: ? A swollen mouth, tongue, or throat. ? Pain or tightness in your chest. ? Trouble breathing. ? Being short of breath. ? Dizziness. ? Fainting. ? Very bad pain in your belly (abdomen). ? Throwing up (vomiting). ? Watery poop (diarrhea). Summary   An allergy means that your body reacts to something that bothers it (allergen). It is not a normal reaction.  Stay away from things that make your body react.  Take over-the-counter and prescription medicines only as told by your doctor.  If you are at risk for a very bad allergy reaction, carry an auto-injector (epinephrine injection) all the time. Also, wear a medical alert bracelet or necklace so people know about your allergy. This information is not intended to replace advice given to you by your health care provider. Make sure you discuss any questions you have with your health care provider. Document Released: 09/26/2012 Document Revised: 09/14/2016 Document Reviewed: 09/14/2016 Elsevier Interactive Patient Education  2019 [place]. Cetirizine tablets What is this medicine? CETIRIZINE (se TI ra zeen) is an antihistamine. This medicine is used to treat or prevent symptoms of allergies. It is also used to help reduce itchy skin rash and hives. This medicine may be used for other purposes; ask your health care provider or pharmacist if you have questions. COMMON BRAND NAME(S): All Day Allergy, Allergy Relief, Zyrtec, Zyrtec Hives Relief What should I tell my health care provider before I take this medicine? They need to know if you have any of these conditions: -kidney disease -liver disease -an unusual or allergic reaction to cetirizine, hydroxyzine, other medicines, foods, dyes, or preservatives -pregnant or trying to get pregnant -breast-feeding How should I use this medicine? Take  this medicine by mouth with a glass of water. Follow the directions on the prescription label. You can take this medicine with food or on an empty stomach. Take your medicine at regular times. Do not take more often than directed. You may need to take this medicine for several days before your symptoms improve. Talk to your pediatrician regarding the use of this medicine in children. Special care may be  needed. While this drug may be prescribed for children as young as 6 years of age for selected conditions, precautions do apply. Overdosage: If you think you have taken too much of this medicine contact a poison control center or emergency room at once. NOTE: This medicine is only for you. Do not share this medicine with others. What if I miss a dose? If you miss a dose, take it as soon as you can. If it is almost time for your next dose, take only that dose. Do not take double or extra doses. What may interact with this medicine? -alcohol -certain medicines for anxiety or sleep -narcotic medicines for pain -other medicines for colds or allergies This list may not describe all possible interactions. Give your health care provider a list of all the medicines, herbs, non-prescription drugs, or dietary supplements you use. Also tell them if you smoke, drink alcohol, or use illegal drugs. Some items may interact with your medicine. What should I watch for while using this medicine? Visit your doctor or health care professional for regular checks on your health. Tell your doctor if your symptoms do not improve. You may get drowsy or dizzy. Do not drive, use machinery, or do anything that needs mental alertness until you know how this medicine affects you. Do not stand or sit up quickly, especially if you are an older patient. This reduces the risk of dizzy or fainting spells. Your mouth may get dry. Chewing sugarless gum or sucking hard candy, and drinking plenty of water may help. Contact your doctor if the problem does not go away or is severe. What side effects may I notice from receiving this medicine? Side effects that you should report to your doctor or health care professional as soon as possible: -allergic reactions like skin rash, itching or hives, swelling of the face, lips, or tongue -changes in vision or hearing -fast or irregular heartbeat -trouble passing urine or change in the amount of  urine Side effects that usually do not require medical attention (report to your doctor or health care professional if they continue or are bothersome): -dizziness -dry mouth -irritability -sore throat -stomach pain -tiredness This list may not describe all possible side effects. Call your doctor for medical advice about side effects. You may report side effects to FDA at 1-800-FDA-1088. Where should I keep my medicine? Keep out of the reach of children. Store at room temperature between 15 and 30 degrees C (59 and 86 degrees F). Throw away any unused medicine after the expiration date. NOTE: This sheet is a summary. It may not cover all possible information. If you have questions about this medicine, talk to your doctor, pharmacist, or health care provider.  2019 Elsevier/Gold Standard (2014-06-26 13:44:42)  

## 2018-11-08 ENCOUNTER — Telehealth: Payer: Self-pay

## 2018-11-08 NOTE — Telephone Encounter (Signed)
Tried to contact patient to due COV-19 screening but call wouldn't go through

## 2018-11-09 ENCOUNTER — Ambulatory Visit: Payer: Medicaid Other | Admitting: Family Medicine

## 2018-11-11 ENCOUNTER — Other Ambulatory Visit: Payer: Self-pay

## 2018-11-11 ENCOUNTER — Encounter: Payer: Self-pay | Admitting: Family Medicine

## 2018-11-11 ENCOUNTER — Ambulatory Visit (INDEPENDENT_AMBULATORY_CARE_PROVIDER_SITE_OTHER): Payer: Medicaid Other | Admitting: Family Medicine

## 2018-11-11 ENCOUNTER — Ambulatory Visit (HOSPITAL_COMMUNITY)
Admission: RE | Admit: 2018-11-11 | Discharge: 2018-11-11 | Disposition: A | Discharge status: Home / Self Care | Payer: Medicaid Other | Source: Ambulatory Visit | Attending: Family Medicine | Admitting: Family Medicine

## 2018-11-11 VITALS — BP 124/60 | HR 90 | Temp 97.9°F | Ht 72.0 in | Wt 158.0 lb

## 2018-11-11 DIAGNOSIS — M25561 Pain in right knee: Secondary | ICD-10-CM | POA: Diagnosis not present

## 2018-11-11 DIAGNOSIS — Z09 Encounter for follow-up examination after completed treatment for conditions other than malignant neoplasm: Secondary | ICD-10-CM

## 2018-11-11 DIAGNOSIS — M25562 Pain in left knee: Secondary | ICD-10-CM

## 2018-11-11 DIAGNOSIS — D571 Sickle-cell disease without crisis: Secondary | ICD-10-CM | POA: Diagnosis not present

## 2018-11-11 DIAGNOSIS — G894 Chronic pain syndrome: Secondary | ICD-10-CM | POA: Diagnosis not present

## 2018-11-11 DIAGNOSIS — J302 Other seasonal allergic rhinitis: Secondary | ICD-10-CM

## 2018-11-11 LAB — POCT URINALYSIS DIP (MANUAL ENTRY)
Bilirubin, UA: NEGATIVE
Blood, UA: NEGATIVE
Glucose, UA: NEGATIVE mg/dL
Ketones, POC UA: NEGATIVE mg/dL
Leukocytes, UA: NEGATIVE
Nitrite, UA: NEGATIVE
Protein Ur, POC: NEGATIVE mg/dL
Spec Grav, UA: 1.015 (ref 1.010–1.025)
Urobilinogen, UA: 1 E.U./dL
pH, UA: 6.5 (ref 5.0–8.0)

## 2018-11-11 MED ORDER — IBUPROFEN 800 MG PO TABS: 800.0000 mg | ORAL_TABLET | Freq: Three times a day (TID) | ORAL | 6 refills | Status: DC | PRN

## 2018-11-11 NOTE — Progress Notes (Signed)
[facility]   Established Patient Office Visit  Subjective:  Patient ID: Derrick Wu, male    DOB: 1995/12/17  Age: 23 y.o. MRN: [id number]  CC:  Chief Complaint  Patient presents with  . Follow-up    sickle cell     HPI Derrick Wu is a 23 year old male who presents for follow up today.   Past Medical History:  Diagnosis Date  . Asthma   . Headache(784.0)   . Pneumonia   . Seasonal allergies   . Sickle cell anemia (HCC)   . Vision abnormalities    Hx: of wears glasses   Current Status: Since his last office visit, he is doing well with no complaints. He states that he has pain in his feet, knees, and hands. He rates his knee pain today at 3/10. He has not had a hospital visit for Sickle Cell Crisis since 06/27/2017 where he was treated and discharged the same day. He is not taking Folic Acid as prescribed. He is currently taking all other medications as prescribed and staying well hydrated. He reports occasional nausea, constipation, dizziness and headaches.   He denies fevers, chills, fatigue, recent infections, weight loss, and night sweats. He has not had any visual changes, and falls. No chest pain, heart palpitations, cough and shortness of breath reported. No reports of GI problems such as vomiting, and diarrhea. He has no reports of blood in stools, dysuria and hematuria. No depression or anxiety reported.   Past Surgical History:  Procedure Laterality Date  . TOOTH EXTRACTION N/A 05/26/2013   Procedure: EXTRACTION WISDOM TEETH - one, sixteen, seventeen and thirtytwo.;  Surgeon: Georgia Lopes, DDS;  Location: [facility] OR;  Service: Oral Surgery;  Laterality: N/A;    Family History  Problem Relation Age of Onset  . Diabetes Mother   . Hypertension Mother   . Vision loss Mother   . Diabetes Father   . Hypertension Father   . Vision loss Father     Social History   Socioeconomic History  . Marital  status: Single    Spouse name: Not on file  . Number of children: Not on file  . Years of education: Not on file  . Highest education level: Not on file  Occupational History  . Not on file  Social Needs  . Financial resource strain: Not on file  . Food insecurity:    Worry: Not on file    Inability: Not on file  . Transportation needs:    Medical: Not on file    Non-medical: Not on file  Tobacco Use  . Smoking status: Never Smoker  . Smokeless tobacco: Never Used  Substance and Sexual Activity  . Alcohol use: No  . Drug use: No  . Sexual activity: Never  Lifestyle  . Physical activity:    Days per week: Not on file    Minutes per session: Not on file  . Stress: Not on file  Relationships  . Social connections:    Talks on phone: Not on file    Gets together: Not on file    Attends religious service: Not on file    Active member of club or organization: Not on file    Attends meetings of clubs or organizations: Not on file    Relationship status: Not on file  . Intimate partner violence:    Fear of current or ex partner: Not on file  Emotionally abused: Not on file    Physically abused: Not on file    Forced sexual activity: Not on file  Other Topics Concern  . Not on file  Social History Narrative   Pt is in 11th grade 2014-15    Outpatient Medications Prior to Visit  Medication Sig Dispense Refill  . ibuprofen (ADVIL,MOTRIN) 800 MG tablet Take 800 mg by mouth every 8 (eight) hours as needed.    . cetirizine (ZYRTEC) 10 MG tablet Take 1 tablet (10 mg total) by mouth daily. (Patient not taking: Reported on 11/11/2018) 30 tablet 11  . folic acid (FOLVITE) 1 MG tablet Take 1 tablet (1 mg total) by mouth daily. (Patient not taking: Reported on 11/11/2018) 30 tablet 6   No facility-administered medications prior to visit.     Allergies  Allergen Reactions  . Mushroom Extract Complex Nausea And Vomiting    ROS Review of Systems  Constitutional: Negative.    HENT: Negative.   Eyes: Negative.   Respiratory: Negative.   Cardiovascular: Negative.   Gastrointestinal: Positive for nausea.  Endocrine: Negative.   Genitourinary: Negative.   Musculoskeletal: Negative.   Skin: Negative.   Allergic/Immunologic: Negative.   Neurological: Positive for dizziness and headaches.  Hematological: Negative.   Psychiatric/Behavioral: Negative.       Objective:    Physical Exam  Constitutional: He is oriented to person, place, and time. He appears well-developed and well-nourished.  HENT:  Head: Normocephalic and atraumatic.  Eyes: Conjunctivae are normal.  Neck: Normal range of motion. Neck supple.  Cardiovascular: Normal rate, regular rhythm, normal heart sounds and intact distal pulses.  Pulmonary/Chest: Effort normal and breath sounds normal.  Abdominal: Soft. Bowel sounds are normal.  Musculoskeletal: Normal range of motion.  Neurological: He is alert and oriented to person, place, and time. He has normal reflexes.  Skin: Skin is warm and dry.  Psychiatric: He has a normal mood and affect. His behavior is normal. Judgment and thought content normal.  Nursing note and vitals reviewed.   BP 124/60 (BP Location: Left Arm, Patient Position: Sitting, Cuff Size: Small)   Pulse 90   Temp 97.9 F (36.6 C) (Oral)   Ht 6' (1.829 m)   Wt 158 lb (71.7 kg)   SpO2 96%   BMI 21.43 kg/m  Wt Readings from Last 3 Encounters:  11/11/18 158 lb (71.7 kg)  09/05/18 160 lb (72.6 kg)  06/03/18 156 lb (70.8 kg)     Health Maintenance Due  Topic Date Due  . TETANUS/TDAP  10/06/2015    There are no preventive care reminders to display for this patient.  No results found for: TSH Lab Results  Component Value Date   WBC 10.0 05/30/2018   HGB 8.3 (L) 05/30/2018   HCT 26.0 (L) 05/30/2018   MCV 76 (L) 05/30/2018   PLT 561 (H) 05/30/2018   Lab Results  Component Value Date   NA 139 05/30/2018   K 4.7 05/30/2018   CO2 22 05/30/2018   GLUCOSE 84  05/30/2018   BUN 5 (L) 05/30/2018   CREATININE 0.67 (L) 05/30/2018   BILITOT 1.3 (H) 05/30/2018   ALKPHOS 110 05/30/2018   AST 40 05/30/2018   ALT 35 05/30/2018   PROT 7.9 05/30/2018   ALBUMIN 4.5 05/30/2018   CALCIUM 9.7 05/30/2018   No results found for: CHOL No results found for: HDL No results found for: LDLCALC No results found for: TRIG No results found for: CHOLHDL No results found for: FSFS2L  Assessment & Plan:   1. Hb-SS disease without crisis [facility](HCC) He is doing well today. He has been having increased knee pain, but he does not taking medication for pain. We will consider the possible initiation of Oxybryta or Hydrea in the near future. He will began Folic Acid and Motrin today. He will continue to take OTC pain medications as prescribed; will continue to avoid extreme heat and cold; will continue to eat a healthy diet and drink at least 64 ounces of water daily; continue stool softener as needed; will avoid colds and flu; will continue to get plenty of sleep and rest; will continue to avoid high stressful situations and remain infection free; will continue Folic Acid 1 mg daily to avoid sickle cell crisis.  - ibuprofen (ADVIL) 800 MG tablet; Take 1 tablet (800 mg total) by mouth every 8 (eight) hours as needed.  Dispense: 30 tablet; Refill: 6  2. Chronic pain syndrome We will initiate Motrin today.  - ibuprofen (ADVIL) 800 MG tablet; Take 1 tablet (800 mg total) by mouth every 8 (eight) hours as needed.  Dispense: 30 tablet; Refill: 6  3. Acute bilateral knee pain  4. Seasonal allergies  5. Follow up He will follow up in 2 months.  - POCT urinalysis dipstick - DG Knee Complete 4 Views Right; Future  Meds ordered this encounter  Medications  . ibuprofen (ADVIL) 800 MG tablet    Sig: Take 1 tablet (800 mg total) by mouth every 8 (eight) hours as needed.    Dispense:  30 tablet    Refill:  6    Orders Placed This Encounter  Procedures  . DG Knee Complete 4  Views Right  . POCT urinalysis dipstick    Referral Orders  No referral(s) requested today    Raliegh IpNatalie Naydeen Speirs,  MSN, FNP-BC [facility] [address]  [place], [place] [id number] [phone]   Problem List Items Addressed This Visit      Other   Chronic pain syndrome   Relevant Medications   ibuprofen (ADVIL) 800 MG tablet   Seasonal allergies   Sickle cell anemia (HCC)   Relevant Medications   ibuprofen (ADVIL) 800 MG tablet    Other Visit Diagnoses    Follow up    -  Primary   Relevant Orders   POCT urinalysis dipstick (Completed)   DG Knee Complete 4 Views Right   Acute bilateral knee pain          Meds ordered this encounter  Medications  . ibuprofen (ADVIL) 800 MG tablet    Sig: Take 1 tablet (800 mg total) by mouth every 8 (eight) hours as needed.    Dispense:  30 tablet    Refill:  6    Follow-up: Return in about 2 months (around 01/11/2019).    Kallie LocksNatalie M Suzanne Kho, FNP

## 2018-11-11 NOTE — Patient Instructions (Signed)

## 2018-11-13 DIAGNOSIS — M25562 Pain in left knee: Secondary | ICD-10-CM | POA: Insufficient documentation

## 2018-11-13 DIAGNOSIS — M25561 Pain in right knee: Secondary | ICD-10-CM | POA: Insufficient documentation

## 2018-12-15 ENCOUNTER — Telehealth: Payer: Self-pay

## 2018-12-15 NOTE — Telephone Encounter (Signed)
This has been entered and patient has been contacted.

## 2018-12-15 NOTE — Telephone Encounter (Signed)
lau 

## 2019-01-06 ENCOUNTER — Other Ambulatory Visit: Payer: Self-pay

## 2019-01-06 ENCOUNTER — Encounter: Payer: Self-pay | Admitting: Family Medicine

## 2019-01-06 ENCOUNTER — Ambulatory Visit (INDEPENDENT_AMBULATORY_CARE_PROVIDER_SITE_OTHER): Payer: Medicaid Other | Admitting: Family Medicine

## 2019-01-06 VITALS — BP 126/80 | HR 74 | Temp 98.0°F | Ht 72.0 in | Wt 160.8 lb

## 2019-01-06 DIAGNOSIS — Z09 Encounter for follow-up examination after completed treatment for conditions other than malignant neoplasm: Secondary | ICD-10-CM

## 2019-01-06 DIAGNOSIS — Z98811 Dental restoration status: Secondary | ICD-10-CM | POA: Diagnosis not present

## 2019-01-06 DIAGNOSIS — G894 Chronic pain syndrome: Secondary | ICD-10-CM | POA: Diagnosis not present

## 2019-01-06 DIAGNOSIS — D571 Sickle-cell disease without crisis: Secondary | ICD-10-CM | POA: Diagnosis not present

## 2019-01-06 LAB — POCT URINALYSIS DIP (MANUAL ENTRY)
Bilirubin, UA: NEGATIVE
Blood, UA: NEGATIVE
Glucose, UA: NEGATIVE mg/dL
Ketones, POC UA: NEGATIVE mg/dL
Leukocytes, UA: NEGATIVE
Nitrite, UA: NEGATIVE
Protein Ur, POC: NEGATIVE mg/dL
Spec Grav, UA: 1.02 (ref 1.010–1.025)
Urobilinogen, UA: 1 E.U./dL
pH, UA: 7 (ref 5.0–8.0)

## 2019-01-06 MED ORDER — FOLIC ACID 1 MG PO TABS: 1.0000 mg | ORAL_TABLET | Freq: Every day | ORAL | 6 refills | Status: DC

## 2019-01-06 NOTE — Progress Notes (Signed)
[facility]    Established Patient Office Visit  Subjective:  Patient ID: Derrick Wu, male    DOB: [birth year]  Age: 23 y.o. MRN: [id number]  CC:  Chief Complaint  Patient presents with  . Follow-up    sickle cell    HPI Derrick Wu is a 23 year old male who presents for follow up today.   Past Medical History:  Diagnosis Date  . Asthma   . Headache(784.0)   . Pneumonia   . Seasonal allergies   . Sickle cell anemia (HCC)   . Vision abnormalities    Hx: of wears glasses   Current Status: Since his last office visit, he is doing well with no complaints. He states that he has pain in his feet, knees, and hands. He wears a knee brace to aid in pain relief. He rates his pain today at 0/10. He has not had a hospital visit for Sickle Cell Crisis since 04/19/2019 where he was treated and discharged the same day. He is currently taking all medications as prescribed and staying well hydrated. He reports occasional nausea, constipation, dizziness and headaches. He continues to [facility]. He is in need of a new filling for tooth.  He denies fevers, chills, fatigue, recent infections, weight loss, and night sweats. He has not had any visual changes, and falls. No chest pain, heart palpitations, cough and shortness of breath reported. No reports of GI problems such as vomiting, and diarrhea.  He has no reports of blood in stools, dysuria and hematuria. No depression or anxiety reported.   Past Surgical History:  Procedure Laterality Date  . TOOTH EXTRACTION N/A 05/26/2013   Procedure: EXTRACTION WISDOM TEETH - one, sixteen, seventeen and thirtytwo.;  Surgeon: Georgia LopesScott M Jensen, DDS;  Location: [facility] OR;  Service: Oral Surgery;  Laterality: N/A;    Family History  Problem Relation Age of Onset  . Diabetes Mother   . Hypertension Mother   . Vision loss Mother   . Diabetes Father   .  Hypertension Father   . Vision loss Father     Social History   Socioeconomic History  . Marital status: Single    Spouse name: Not on file  . Number of children: Not on file  . Years of education: Not on file  . Highest education level: Not on file  Occupational History  . Not on file  Social Needs  . Financial resource strain: Not on file  . Food insecurity    Worry: Not on file    Inability: Not on file  . Transportation needs    Medical: Not on file    Non-medical: Not on file  Tobacco Use  . Smoking status: Never Smoker  . Smokeless tobacco: Never Used  Substance and Sexual Activity  . Alcohol use: No  . Drug use: No  . Sexual activity: Never  Lifestyle  . Physical activity    Days per week: Not on file    Minutes per session: Not on file  . Stress: Not on file  Relationships  . Social Musicianconnections    Talks on phone: Not on file    Gets together: Not on file    Attends religious service: Not on file    Active member of club or organization: Not on file    Attends meetings of clubs or organizations: Not on file    Relationship status: Not on  file  . Intimate partner violence    Fear of current or ex partner: Not on file    Emotionally abused: Not on file    Physically abused: Not on file    Forced sexual activity: Not on file  Other Topics Concern  . Not on file  Social History Narrative   Pt is in 11th grade 2014-15    Outpatient Medications Prior to Visit  Medication Sig Dispense Refill  . cetirizine (ZYRTEC) 10 MG tablet Take 1 tablet (10 mg total) by mouth daily. 30 tablet 11  . ibuprofen (ADVIL) 800 MG tablet Take 1 tablet (800 mg total) by mouth every 8 (eight) hours as needed. 30 tablet 6  . folic acid (FOLVITE) 1 MG tablet Take 1 tablet (1 mg total) by mouth daily. (Patient not taking: Reported on 11/11/2018) 30 tablet 6   No facility-administered medications prior to visit.     Allergies  Allergen Reactions  . Mushroom Extract Complex Nausea  And Vomiting    ROS Review of Systems  Constitutional: Negative.   HENT: Negative.   Eyes: Negative.   Respiratory: Negative.   Cardiovascular: Negative.   Gastrointestinal: Negative.   Genitourinary: Negative.   Musculoskeletal: Positive for arthralgias (generalized).  Skin: Negative.   Allergic/Immunologic: Negative.   Neurological: Positive for dizziness (occcasional) and headaches (occasional ).  Hematological: Negative.   Psychiatric/Behavioral: Negative.     Objective:    Physical Exam  Constitutional: He is oriented to person, place, and time. He appears well-developed and well-nourished.  HENT:  Head: Normocephalic and atraumatic.  Eyes: Conjunctivae are normal.  Neck: Normal range of motion. Neck supple.  Cardiovascular: Normal rate, regular rhythm, normal heart sounds and intact distal pulses.  Pulmonary/Chest: Effort normal and breath sounds normal.  Abdominal: Soft. Bowel sounds are normal.  Musculoskeletal: Normal range of motion.  Neurological: He is alert and oriented to person, place, and time. He has normal reflexes.  Skin: Skin is warm and dry.  Psychiatric: He has a normal mood and affect. His behavior is normal. Judgment and thought content normal.  Nursing note and vitals reviewed.   BP 126/80 (BP Location: Left Arm, Patient Position: Sitting, Cuff Size: Small)   Pulse 74   Temp 98 F (36.7 C) (Oral)   Ht 6' (1.829 m)   Wt 160 lb 12.8 oz (72.9 kg)   SpO2 98%   BMI 21.81 kg/m  Wt Readings from Last 3 Encounters:  01/06/19 160 lb 12.8 oz (72.9 kg)  11/11/18 158 lb (71.7 kg)  09/05/18 160 lb (72.6 kg)     There are no preventive care reminders to display for this patient.  There are no preventive care reminders to display for this patient.  No results found for: TSH Lab Results  Component Value Date   WBC 10.0 05/30/2018   HGB 8.3 (L) 05/30/2018   HCT 26.0 (L) 05/30/2018   MCV 76 (L) 05/30/2018   PLT 561 (H) 05/30/2018   Lab Results   Component Value Date   NA 139 05/30/2018   K 4.7 05/30/2018   CO2 22 05/30/2018   GLUCOSE 84 05/30/2018   BUN 5 (L) 05/30/2018   CREATININE 0.67 (L) 05/30/2018   BILITOT 1.3 (H) 05/30/2018   ALKPHOS 110 05/30/2018   AST 40 05/30/2018   ALT 35 05/30/2018   PROT 7.9 05/30/2018   ALBUMIN 4.5 05/30/2018   CALCIUM 9.7 05/30/2018   No results found for: CHOL No results found for: HDL No results found  for: LDLCALC No results found for: TRIG No results found for: CHOLHDL No results found for: [id number]  Assessment & Plan:   1. Hb-SS disease without crisis [facility](HCC) He continues use knee brace for knee pain. He is doing well today. He will continue to take pain medications as prescribed; will continue to avoid extreme heat and cold; will continue to eat a healthy diet and drink at least 64 ounces of water daily; continue stool softener as needed; will avoid colds and flu; will continue to get plenty of sleep and rest; will continue to avoid high stressful situations and remain infection free; will continue Folic Acid 1 mg daily to avoid sickle cell crisis.  - folic acid (FOLVITE) 1 MG tablet; Take 1 tablet (1 mg total) by mouth daily.  Dispense: 30 tablet; Refill: 6  2. Chronic pain syndrome  3. Dental filling status Currently trying to get earlier dental appointment for repair of dental filling.   4. Follow up Follow up in 3 months.   - POCT urinalysis dipstick  Meds ordered this encounter  Medications  . folic acid (FOLVITE) 1 MG tablet    Sig: Take 1 tablet (1 mg total) by mouth daily.    Dispense:  30 tablet    Refill:  6    Orders Placed This Encounter  Procedures  . POCT urinalysis dipstick    Referral Orders  No referral(s) requested today    Raliegh IpNatalie Aby Gessel,  MSN, FNP-BC [facility] [address]  [place], [place] [address] [phone] fax   Problem List Items Addressed This Visit       Other   Chronic pain syndrome   Sickle cell anemia (HCC) - Primary   Relevant Medications   folic acid (FOLVITE) 1 MG tablet    Other Visit Diagnoses    Dental filling status       Follow up       Relevant Orders   POCT urinalysis dipstick (Completed)      Meds ordered this encounter  Medications  . folic acid (FOLVITE) 1 MG tablet    Sig: Take 1 tablet (1 mg total) by mouth daily.    Dispense:  30 tablet    Refill:  6    Follow-up: Return in about 3 months (around 04/08/2019).    Kallie LocksNatalie M Tinia Oravec, FNP

## 2019-04-10 ENCOUNTER — Ambulatory Visit: Payer: Medicaid Other | Admitting: Family Medicine

## 2019-04-24 ENCOUNTER — Ambulatory Visit: Payer: Medicaid Other | Admitting: Family Medicine

## 2019-04-24 ENCOUNTER — Other Ambulatory Visit: Payer: Self-pay | Admitting: Family Medicine

## 2019-04-24 DIAGNOSIS — D571 Sickle-cell disease without crisis: Secondary | ICD-10-CM

## 2019-06-07 ENCOUNTER — Ambulatory Visit (INDEPENDENT_AMBULATORY_CARE_PROVIDER_SITE_OTHER): Payer: Medicaid Other | Admitting: Family Medicine

## 2019-06-07 ENCOUNTER — Encounter: Payer: Self-pay | Admitting: Family Medicine

## 2019-06-07 ENCOUNTER — Other Ambulatory Visit: Payer: Self-pay

## 2019-06-07 VITALS — BP 110/91 | HR 65 | Temp 98.4°F | Wt 160.0 lb

## 2019-06-07 DIAGNOSIS — Z09 Encounter for follow-up examination after completed treatment for conditions other than malignant neoplasm: Secondary | ICD-10-CM | POA: Diagnosis not present

## 2019-06-07 DIAGNOSIS — G894 Chronic pain syndrome: Secondary | ICD-10-CM

## 2019-06-07 DIAGNOSIS — D571 Sickle-cell disease without crisis: Secondary | ICD-10-CM

## 2019-06-07 DIAGNOSIS — Z202 Contact with and (suspected) exposure to infections with a predominantly sexual mode of transmission: Secondary | ICD-10-CM | POA: Diagnosis not present

## 2019-06-07 LAB — POCT URINALYSIS DIP (CLINITEK)
Bilirubin, UA: NEGATIVE
Blood, UA: NEGATIVE
Glucose, UA: NEGATIVE mg/dL
Ketones, POC UA: NEGATIVE mg/dL
Leukocytes, UA: NEGATIVE
Nitrite, UA: NEGATIVE
POC PROTEIN,UA: NEGATIVE
Spec Grav, UA: 1.02 (ref 1.010–1.025)
Urobilinogen, UA: 1 E.U./dL
pH, UA: 6 (ref 5.0–8.0)

## 2019-06-07 NOTE — Progress Notes (Signed)
[facility]   Established Patient Office Visit  Subjective:  Patient ID: Derrick Wu, male    DOB: 02-Dec-1995  Age: 23 y.o. MRN: [id number]  CC:  Chief Complaint  Patient presents with  . Follow-up    HPI Derrick Wu is a 23 year old male who presents for Follow Up today.   Past Medical History:  Diagnosis Date  . Asthma   . Headache(784.0)   . Pneumonia   . Seasonal allergies   . Sickle cell anemia (HCC)   . Vision abnormalities    Hx: of wears glasses   Current Status: Since his last office visit, he is doing well with no complaints. He states that he has pain in multiple joints. He rates his pain today at 0/10. He has not had a hospital visit for Sickle Cell Crisis in many years where he was treated and discharged the same day. He is currently taking all medications as prescribed and staying well hydrated. He reports occasional nausea, constipation, dizziness and headaches. He recently graduated with Biology degree from [place], [place]. He denies fevers, chills, fatigue, recent infections, weight loss, and night sweats. He has not had any visual changes, and falls. No chest pain, heart palpitations, cough and shortness of breath reported. No reports of GI problems such as vomiting, and diarrhea. He has no reports of blood in stools, dysuria and hematuria. No depression or anxiety reported today. He denies suicidal ideations, homicidal ideations, or auditory hallucinations. He denies pain today.   Past Surgical History:  Procedure Laterality Date  . TOOTH EXTRACTION N/A 05/26/2013   Procedure: EXTRACTION WISDOM TEETH - one, sixteen, seventeen and thirtytwo.;  Surgeon: Gae Bon, DDS;  Location: [facility];  Service: Oral Surgery;  Laterality: N/A;    Family History  Problem Relation Age of Onset  . Diabetes Mother   . Hypertension Mother   . Vision loss Mother   . Diabetes Father   . Hypertension Father    . Vision loss Father     Social History   Socioeconomic History  . Marital status: Single    Spouse name: Not on file  . Number of children: Not on file  . Years of education: Not on file  . Highest education level: Not on file  Occupational History  . Not on file  Tobacco Use  . Smoking status: Never Smoker  . Smokeless tobacco: Never Used  Substance and Sexual Activity  . Alcohol use: No  . Drug use: No  . Sexual activity: Yes    Birth control/protection: Condom  Other Topics Concern  . Not on file  Social History Narrative   Pt is in 11th grade 2014-15   Social Determinants of Health   Financial Resource Strain:   . Difficulty of Paying Living Expenses: Not on file  Food Insecurity:   . Worried About Charity fundraiser in the Last Year: Not on file  . Ran Out of Food in the Last Year: Not on file  Transportation Needs:   . Lack of Transportation (Medical): Not on file  . Lack of Transportation (Non-Medical): Not on file  Physical Activity:   . Days of Exercise per Week: Not on file  . Minutes of Exercise per Session: Not on file  Stress:   . Feeling of Stress : Not on file  Social Connections:   . Frequency of Communication with Friends and Family: Not on file  .  Frequency of Social Gatherings with Friends and Family: Not on file  . Attends Religious Services: Not on file  . Active Member of Clubs or Organizations: Not on file  . Attends Banker Meetings: Not on file  . Marital Status: Not on file  Intimate Partner Violence:   . Fear of Current or Ex-Partner: Not on file  . Emotionally Abused: Not on file  . Physically Abused: Not on file  . Sexually Abused: Not on file    Outpatient Medications Prior to Visit  Medication Sig Dispense Refill  . cetirizine (ZYRTEC) 10 MG tablet Take 1 tablet (10 mg total) by mouth daily. 30 tablet 11  . folic acid (FOLVITE) 1 MG tablet Take 1 tablet (1 mg total) by mouth daily. 30 tablet 6  . ibuprofen  (ADVIL) 800 MG tablet Take 1 tablet (800 mg total) by mouth every 8 (eight) hours as needed. 30 tablet 6   No facility-administered medications prior to visit.    Allergies  Allergen Reactions  . Mushroom Extract Complex Nausea And Vomiting    ROS Review of Systems  Constitutional: Negative.   HENT: Negative.   Eyes: Negative.   Respiratory: Negative.   Cardiovascular: Negative.   Gastrointestinal: Positive for abdominal pain (flank pain; occasional ).  Endocrine: Negative.   Genitourinary: Negative.   Musculoskeletal: Positive for arthralgias (occasional joint pain).  Skin: Negative.   Allergic/Immunologic: Negative.   Neurological: Positive for dizziness (occasional ) and headaches (ocasional ).  Hematological: Negative.   Psychiatric/Behavioral: Negative.       Objective:    Physical Exam  Constitutional: He is oriented to person, place, and time. He appears well-developed and well-nourished.  HENT:  Head: Normocephalic and atraumatic.  Eyes: Conjunctivae are normal.  Cardiovascular: Normal rate, regular rhythm, normal heart sounds and intact distal pulses.  Pulmonary/Chest: Effort normal and breath sounds normal.  Abdominal: Soft. Bowel sounds are normal.  Musculoskeletal:        General: Normal range of motion.     Cervical back: Normal range of motion and neck supple.  Neurological: He is alert and oriented to person, place, and time. He has normal reflexes.  Skin: Skin is warm and dry.  Psychiatric: He has a normal mood and affect. His behavior is normal. Judgment and thought content normal.  Nursing note and vitals reviewed.   BP (!) 110/91   Pulse 65   Temp 98.4 F (36.9 C) (Oral)   Wt 160 lb (72.6 kg)   SpO2 95%   BMI 21.70 kg/m  Wt Readings from Last 3 Encounters:  06/07/19 160 lb (72.6 kg)  01/06/19 160 lb 12.8 oz (72.9 kg)  11/11/18 158 lb (71.7 kg)     Health Maintenance Due  Topic Date Due  . INFLUENZA VACCINE  01/14/2019    There are  no preventive care reminders to display for this patient.  No results found for: TSH Lab Results  Component Value Date   WBC 10.0 05/30/2018   HGB 8.3 (L) 05/30/2018   HCT 26.0 (L) 05/30/2018   MCV 76 (L) 05/30/2018   PLT 561 (H) 05/30/2018   Lab Results  Component Value Date   NA 139 05/30/2018   K 4.7 05/30/2018   CO2 22 05/30/2018   GLUCOSE 84 05/30/2018   BUN 5 (L) 05/30/2018   CREATININE 0.67 (L) 05/30/2018   BILITOT 1.3 (H) 05/30/2018   ALKPHOS 110 05/30/2018   AST 40 05/30/2018   ALT 35 05/30/2018   PROT  7.9 05/30/2018   ALBUMIN 4.5 05/30/2018   CALCIUM 9.7 05/30/2018   No results found for: CHOL No results found for: HDL No results found for: LDLCALC No results found for: TRIG No results found for: CHOLHDL No results found for: [id number]    Assessment & Plan:   1. Hb-SS disease without crisis [facility](HCC) He is doing well today. He will continue to take pain medications as prescribed; will continue to avoid extreme heat and cold; will continue to eat a healthy diet and drink at least 64 ounces of water daily; continue stool softener as needed; will avoid colds and flu; will continue to get plenty of sleep and rest; will continue to avoid high stressful situations and remain infection free; will continue Folic Acid 1 mg daily to avoid sickle cell crisis. Continue to follow up with Hematologist as needed.  - POCT URINALYSIS DIP (CLINITEK)  2. Chronic pain syndrome  3. Possible exposure to STD - GC/Chlamydia Probe Amp([facility]) - Trichomonas vaginalis, RNA  4. Follow up He will follow up in 2 months.   No orders of the defined types were placed in this encounter.   Orders Placed This Encounter  Procedures  . GC/Chlamydia Probe Amp([facility])  . Trichomonas vaginalis, RNA  . POCT URINALYSIS DIP (CLINITEK)    Referral Orders  No referral(s) requested today    Raliegh IpNatalie Sabin Gibeault,  MSN, FNP-BC [facility] [address]  [place], [place] [address] [phone] fax   Problem List Items Addressed This Visit      Other   Chronic pain syndrome   Sickle cell anemia (HCC) - Primary   Relevant Orders   POCT URINALYSIS DIP (CLINITEK) (Completed)    Other Visit Diagnoses    Possible exposure to STD       Relevant Orders   GC/Chlamydia Probe Amp([facility]) (Completed)   Trichomonas vaginalis, RNA (Completed)   Follow up          No orders of the defined types were placed in this encounter.   Follow-up: Return in about 6 months (around 12/06/2019).    Kallie LocksNatalie M Brightyn Mozer, FNP

## 2019-06-07 NOTE — Progress Notes (Signed)
Pt is requesting to be tested for STD

## 2019-06-08 LAB — TRICHOMONAS VAGINALIS, PROBE AMP: Trich vag by NAA: NEGATIVE

## 2019-06-08 LAB — GC/CHLAMYDIA PROBE AMP
Chlamydia trachomatis, NAA: NEGATIVE
Neisseria Gonorrhoeae by PCR: NEGATIVE

## 2019-06-15 ENCOUNTER — Telehealth: Payer: Self-pay

## 2019-06-15 NOTE — Telephone Encounter (Signed)
Patient not available. Message left with Mother for call back (no details given).

## 2019-06-15 NOTE — Telephone Encounter (Signed)
Patient aware of + result

## 2019-11-22 ENCOUNTER — Telehealth: Payer: Self-pay | Admitting: Family Medicine

## 2019-11-22 NOTE — Telephone Encounter (Signed)
Pt wants a call back to discuss a possible Prescription to help with a Heel spur.

## 2019-11-23 NOTE — Telephone Encounter (Signed)
Pt needs an appt to come , appt  was made for patient to come in.

## 2019-11-24 ENCOUNTER — Ambulatory Visit (INDEPENDENT_AMBULATORY_CARE_PROVIDER_SITE_OTHER): Payer: Medicaid Other | Admitting: Nurse Practitioner

## 2019-11-24 ENCOUNTER — Ambulatory Visit (HOSPITAL_COMMUNITY)
Admission: RE | Admit: 2019-11-24 | Discharge: 2019-11-24 | Disposition: A | Discharge status: Home / Self Care | Payer: Medicaid Other | Source: Ambulatory Visit | Attending: Nurse Practitioner | Admitting: Nurse Practitioner

## 2019-11-24 ENCOUNTER — Encounter: Payer: Self-pay | Admitting: Nurse Practitioner

## 2019-11-24 ENCOUNTER — Other Ambulatory Visit: Payer: Self-pay

## 2019-11-24 VITALS — BP 139/52 | HR 78 | Temp 97.7°F | Wt 163.8 lb

## 2019-11-24 DIAGNOSIS — D571 Sickle-cell disease without crisis: Secondary | ICD-10-CM | POA: Diagnosis not present

## 2019-11-24 DIAGNOSIS — M79671 Pain in right foot: Secondary | ICD-10-CM | POA: Diagnosis not present

## 2019-11-24 DIAGNOSIS — M79672 Pain in left foot: Secondary | ICD-10-CM | POA: Diagnosis not present

## 2019-11-24 NOTE — Progress Notes (Signed)
° °  [facility] Patient [facility] [address] [place], [place]  [address] Phone:  [phone]   Fax:  [phone]   Acute Office Visit  Subjective:    Patient ID: Derrick Wu, male    DOB: 12-25-1995, 23 y.o.   MRN: [id number]  Chief Complaint  Patient presents with   Foot Pain    foot pain , swelling x 1 week, working on feet  40 hours a week ,otc meds     HPI  Derrick Wu is a 24 y.o. male who presents with bilateral L>.R foot pain. Onset of the symptoms was several days ago. The pain was worse on Tuesday. Precipitating event: increased activity he works long hours at [employer]. Current symptoms include: ability to bear weight, but with some pain and swelling. Aggravating factors: any weight bearing and walking. Symptoms have been intermittent. Patient has had no prior foot problems. Evaluation to date: none. Treatment to date: OTC analgesics which are somewhat effective and rest. He uses a cream and wraps his feet at night.    The following portions of the patient's history were reviewed and updated as appropriate: allergies, current medications, past family history, past medical history, past social history, past surgical history and problem list.  Review of Systems Pertinent items are noted in HPI.    Objective:    BP (!) 139/52 (BP Location: Left Arm, Patient Position: Sitting)    Pulse 78    Temp 97.7 F (36.5 C)    Wt 163 lb 12.8 oz (74.3 kg)    SpO2 94%    BMI 22.22 kg/m  Right foot:  normal exam, no swelling, tenderness, instability; ligaments intact, full range of motion of all ankle/foot joints  Left foot:  point tenderness over the heel pad   Imaging: X-ray of both feet: ordered, but results not yet available     Assessment & Plan:   Problem List Items Addressed This Visit      Other   Sickle cell anemia (HCC)   Relevant Orders   Sickle Cell Panel (Completed)    Other Visit Diagnoses    Pain of right heel    -  Primary   Relevant  Orders   DG Foot Complete Right (Completed)   Pain of left heel       Relevant Orders   DG Foot Complete Left (Completed)       No orders of the defined types were placed in this encounter.    Barbette Merino, NP

## 2019-11-24 NOTE — Patient Instructions (Addendum)
Plantar Fasciitis  Plantar fasciitis is a painful foot condition that affects the heel. It occurs when the band of tissue that connects the toes to the heel bone (plantar fascia) becomes irritated. This can happen as the result of exercising too much or doing other repetitive activities (overuse injury). The pain from plantar fasciitis can range from mild irritation to severe pain that makes it difficult to walk or move. The pain is usually worse in the morning after sleeping, or after sitting or lying down for a while. Pain may also be worse after long periods of walking or standing. What are the causes? This condition may be caused by:  Standing for long periods of time.  Wearing shoes that do not have good arch support.  Doing activities that put stress on joints (high-impact activities), including running, aerobics, and ballet.  Being overweight.  An abnormal way of walking (gait).  Tight muscles in the back of your lower leg (calf).  High arches in your feet.  Starting a new athletic activity. What are the signs or symptoms? The main symptom of this condition is heel pain. Pain may:  Be worse with first steps after a time of rest, especially in the morning after sleeping or after you have been sitting or lying down for a while.  Be worse after long periods of standing still.  Decrease after 30-45 minutes of activity, such as gentle walking. How is this diagnosed? This condition may be diagnosed based on your medical history and your symptoms. Your health care provider may ask questions about your activity level. Your health care provider will do a physical exam to check for:  A tender area on the bottom of your foot.  A high arch in your foot.  Pain when you move your foot.  Difficulty moving your foot. You may have imaging tests to confirm the diagnosis, such as:  X-rays.  Ultrasound.  MRI. How is this treated? Treatment for plantar fasciitis depends on how  severe your condition is. Treatment may include:  Rest, ice, applying pressure (compression), and raising the affected foot (elevation). This may be called RICE therapy. Your health care provider may recommend RICE therapy along with over-the-counter pain medicines to manage your pain.  Exercises to stretch your calves and your plantar fascia.  A splint that holds your foot in a stretched, upward position while you sleep (night splint).  Physical therapy to relieve symptoms and prevent problems in the future.  Injections of steroid medicine (cortisone) to relieve pain and inflammation.  Stimulating your plantar fascia with electrical impulses (extracorporeal shock wave therapy). This is usually the last treatment option before surgery.  Surgery, if other treatments have not worked after 12 months. Follow these instructions at home:  Managing pain, stiffness, and swelling  If directed, put ice on the painful area: ? Put ice in a plastic bag, or use a frozen bottle of water. ? Place a towel between your skin and the bag or bottle. ? Roll the bottom of your foot over the bag or bottle. ? Do this for 20 minutes, 2-3 times a day.  Wear athletic shoes that have air-sole or gel-sole cushions, or try wearing soft shoe inserts that are designed for plantar fasciitis.  Raise (elevate) your foot above the level of your heart while you are sitting or lying down. Activity  Avoid activities that cause pain. Ask your health care provider what activities are safe for you.  Do physical therapy exercises and stretches as told   by your health care provider.  Try activities and forms of exercise that are easier on your joints (low-impact). Examples include swimming, water aerobics, and biking. General instructions  Take over-the-counter and prescription medicines only as told by your health care provider.  Wear a night splint while sleeping, if told by your health care provider. Loosen the splint  if your toes tingle, become numb, or turn cold and blue.  Maintain a healthy weight, or work with your health care provider to lose weight as needed.  Keep all follow-up visits as told by your health care provider. This is important. Contact a health care provider if you:  Have symptoms that do not go away after caring for yourself at home.  Have pain that gets worse.  Have pain that affects your ability to move or do your daily activities. Summary  Plantar fasciitis is a painful foot condition that affects the heel. It occurs when the band of tissue that connects the toes to the heel bone (plantar fascia) becomes irritated.  The main symptom of this condition is heel pain that may be worse after exercising too much or standing still for a long time.  Treatment varies, but it usually starts with rest, ice, compression, and elevation (RICE therapy) and over-the-counter medicines to manage pain. This information is not intended to replace advice given to you by your health care provider. Make sure you discuss any questions you have with your health care provider. Document Revised: 05/14/2017 Document Reviewed: 03/29/2017 Elsevier Patient Education  2020 [employer]  Plantar Fasciitis Rehab Ask your health care provider which exercises are safe for you. Do exercises exactly as told by your health care provider and adjust them as directed. It is normal to feel mild stretching, pulling, tightness, or discomfort as you do these exercises. Stop right away if you feel sudden pain or your pain gets worse. Do not begin these exercises until told by your health care provider. Stretching and range-of-motion exercises These exercises warm up your muscles and joints and improve the movement and flexibility of your foot. These exercises also help to relieve pain. Plantar fascia stretch  1. Sit with your left / right leg crossed over your opposite knee. 2. Hold your heel with one hand with that  thumb near your arch. With your other hand, hold your toes and gently pull them back toward the top of your foot. You should feel a stretch on the bottom of your toes or your foot (plantar fascia) or both. 3. Hold this stretch for__________ seconds. 4. Slowly release your toes and return to the starting position. Repeat __________ times. Complete this exercise __________ times a day. Gastrocnemius stretch, standing This exercise is also called a calf (gastroc) stretch. It stretches the muscles in the back of the upper calf. 1. Stand with your hands against a wall. 2. Extend your left / right leg behind you, and bend your front knee slightly. 3. Keeping your heels on the floor and your back knee straight, shift your weight toward the wall. Do not arch your back. You should feel a gentle stretch in your upper left / right calf. 4. Hold this position for __________ seconds. Repeat __________ times. Complete this exercise __________ times a day. Soleus stretch, standing This exercise is also called a calf (soleus) stretch. It stretches the muscles in the back of the lower calf. 1. Stand with your hands against a wall. 2. Extend your left / right leg behind you, and bend your front   knee slightly. 3. Keeping your heels on the floor, bend your back knee and shift your weight slightly over your back leg. You should feel a gentle stretch deep in your lower calf. 4. Hold this position for __________ seconds. Repeat __________ times. Complete this exercise __________ times a day. Gastroc and soleus stretch, standing step This exercise stretches the muscles in the back of the lower leg. These muscles are in the upper calf (gastrocnemius) and the lower calf (soleus). 1. Stand with the ball of your left / right foot on a step. The ball of your foot is on the walking surface, right under your toes. 2. Keep your other foot firmly on the same step. 3. Hold on to the wall or a railing for balance. 4. Slowly  lift your other foot, allowing your body weight to press your left / right heel down over the edge of the step. You should feel a stretch in your left / right calf. 5. Hold this position for __________ seconds. 6. Return both feet to the step. 7. Repeat this exercise with a slight bend in your left / right knee. Repeat __________ times with your left / right knee straight and __________ times with your left / right knee bent. Complete this exercise __________ times a day. Balance exercise This exercise builds your balance and strength control of your arch to help take pressure off your plantar fascia. Single leg stand If this exercise is too easy, you can try it with your eyes closed or while standing on a pillow. 1. Without shoes, stand near a railing or in a doorway. You may hold on to the railing or door frame as needed. 2. Stand on your left / right foot. Keep your big toe down on the floor and try to keep your arch lifted. Do not let your foot roll inward. 3. Hold this position for __________ seconds. Repeat __________ times. Complete this exercise __________ times a day. This information is not intended to replace advice given to you by your health care provider. Make sure you discuss any questions you have with your health care provider. Document Revised: 09/22/2018 Document Reviewed: 03/30/2018 Elsevier Patient Education  2020 Elsevier Inc.  

## 2019-11-25 LAB — CMP14+CBC/D/PLT+FER+RETIC+V...
ALT: 24 IU/L (ref 0–44)
AST: 43 IU/L — ABNORMAL HIGH (ref 0–40)
Albumin/Globulin Ratio: 1.2 (ref 1.2–2.2)
Albumin: 4.5 g/dL (ref 4.1–5.2)
Alkaline Phosphatase: 95 IU/L (ref 48–121)
BUN/Creatinine Ratio: 7 — ABNORMAL LOW (ref 9–20)
BUN: 5 mg/dL — ABNORMAL LOW (ref 6–20)
Basophils Absolute: 0.1 10*3/uL (ref 0.0–0.2)
Basos: 1 %
Bilirubin Total: 1.2 mg/dL (ref 0.0–1.2)
CO2: 21 mmol/L (ref 20–29)
Calcium: 9.6 mg/dL (ref 8.7–10.2)
Chloride: 100 mmol/L (ref 96–106)
Creatinine, Ser: 0.76 mg/dL (ref 0.76–1.27)
EOS (ABSOLUTE): 0.4 10*3/uL (ref 0.0–0.4)
Eos: 4 %
Ferritin: 36 ng/mL (ref 30–400)
GFR calc Af Amer: 149 mL/min/{1.73_m2} (ref >59)
GFR calc non Af Amer: 129 mL/min/{1.73_m2} (ref >59)
Globulin, Total: 3.7 g/dL (ref 1.5–4.5)
Glucose: 83 mg/dL (ref 65–99)
Hematocrit: 27.8 % — ABNORMAL LOW (ref 37.5–51.0)
Hemoglobin: 8.5 g/dL — ABNORMAL LOW (ref 13.0–17.7)
Immature Grans (Abs): 0 10*3/uL (ref 0.0–0.1)
Immature Granulocytes: 0 %
Lymphocytes Absolute: 4 10*3/uL — ABNORMAL HIGH (ref 0.7–3.1)
Lymphs: 47 %
MCH: 23.7 pg — ABNORMAL LOW (ref 26.6–33.0)
MCHC: 30.6 g/dL — ABNORMAL LOW (ref 31.5–35.7)
MCV: 78 fL — ABNORMAL LOW (ref 79–97)
Monocytes Absolute: 0.9 10*3/uL (ref 0.1–0.9)
Monocytes: 11 %
Neutrophils Absolute: 3.1 10*3/uL (ref 1.4–7.0)
Neutrophils: 37 %
Platelets: 581 10*3/uL — ABNORMAL HIGH (ref 150–450)
Potassium: 4.7 mmol/L (ref 3.5–5.2)
RBC: 3.58 x10E6/uL — ABNORMAL LOW (ref 4.14–5.80)
RDW: 23.4 % — ABNORMAL HIGH (ref 11.6–15.4)
Retic Ct Pct: 7.2 % — ABNORMAL HIGH (ref 0.6–2.6)
Sodium: 140 mmol/L (ref 134–144)
Total Protein: 8.2 g/dL (ref 6.0–8.5)
Vit D, 25-Hydroxy: 11 ng/mL — ABNORMAL LOW (ref 30.0–100.0)
WBC: 8.5 10*3/uL (ref 3.4–10.8)

## 2019-11-27 ENCOUNTER — Other Ambulatory Visit: Payer: Self-pay | Admitting: Nurse Practitioner

## 2019-11-27 ENCOUNTER — Encounter: Payer: Self-pay | Admitting: Nurse Practitioner

## 2019-11-27 DIAGNOSIS — E559 Vitamin D deficiency, unspecified: Secondary | ICD-10-CM | POA: Insufficient documentation

## 2019-11-27 MED ORDER — ERGOCALCIFEROL 1.25 MG (50000 UT) PO CAPS: 50000.0000 [IU] | ORAL_CAPSULE | ORAL | 0 refills | Status: AC

## 2019-12-04 ENCOUNTER — Ambulatory Visit (INDEPENDENT_AMBULATORY_CARE_PROVIDER_SITE_OTHER): Payer: Medicaid Other | Admitting: Family Medicine

## 2019-12-04 ENCOUNTER — Other Ambulatory Visit: Payer: Self-pay

## 2019-12-04 ENCOUNTER — Encounter: Payer: Self-pay | Admitting: Family Medicine

## 2019-12-04 VITALS — BP 128/58 | HR 103 | Temp 99.7°F | Wt 157.0 lb

## 2019-12-04 DIAGNOSIS — Z202 Contact with and (suspected) exposure to infections with a predominantly sexual mode of transmission: Secondary | ICD-10-CM

## 2019-12-04 DIAGNOSIS — Z09 Encounter for follow-up examination after completed treatment for conditions other than malignant neoplasm: Secondary | ICD-10-CM

## 2019-12-04 DIAGNOSIS — G894 Chronic pain syndrome: Secondary | ICD-10-CM

## 2019-12-04 DIAGNOSIS — M79672 Pain in left foot: Secondary | ICD-10-CM | POA: Diagnosis not present

## 2019-12-04 DIAGNOSIS — D571 Sickle-cell disease without crisis: Secondary | ICD-10-CM | POA: Diagnosis not present

## 2019-12-04 MED ORDER — OXYCODONE-ACETAMINOPHEN 5-325 MG PO TABS: 1.0000 | ORAL_TABLET | Freq: Three times a day (TID) | ORAL | 0 refills | Status: DC | PRN

## 2019-12-04 NOTE — Progress Notes (Signed)
[facility]   Established Patient Office Visit  Subjective:  Patient ID: Derrick Wu, male    DOB: [birth year]  Age: 24 y.o. MRN: [id number]  CC:  Chief Complaint  Patient presents with  . Follow-up    foot pain; ibuprofen not working; STD testing     HPI Derrick Wu is a 24 year old male who presents for Follow Up today.     Patient Active Problem List   Diagnosis Date Noted  . Pain of left heel 12/04/2019  . Vitamin D deficiency 11/27/2019  . Dental filling status 01/06/2019  . Acute bilateral knee pain 11/13/2018  . Chronic pain syndrome 09/05/2018  . Seasonal allergies 09/05/2018  . Sickle cell anemia (HCC) 01/07/2017  . Rash and nonspecific skin eruption 01/07/2017   Current Status: Since his last office visit, he is doing well with no complaints. He states that he has pain in his feet; L> R. He rates his pain today at 5/10. He has not had a hospital visit for Sickle Cell Crisis in many years. He is currently taking all medications as prescribed and staying well hydrated. He reports occasional nausea, constipation, dizziness and headaches. He continues to have chronic bilateral foot and heel pain. He is taking 2 of Motrin 800 mg tablets at bedtime, with awakening in increasing. He denies fevers, chills, fatigue, recent infections, weight loss, and night sweats. He has not had any visual changes, and falls. No chest pain, heart palpitations, cough and shortness of breath reported. Denies GI problems such as vomiting, and diarrhea. He has no reports of blood in stools, dysuria and hematuria. Anxiety is stable today. He denies suicidal ideations, homicidal ideations, or auditory hallucinations. He is taking all medications as prescribed.   Patient Active Problem List   Diagnosis Date Noted  . Pain of left heel 12/04/2019  . Vitamin D deficiency 11/27/2019  . Dental filling status 01/06/2019  . Acute bilateral  knee pain 11/13/2018  . Chronic pain syndrome 09/05/2018  . Seasonal allergies 09/05/2018  . Sickle cell anemia (HCC) 01/07/2017  . Rash and nonspecific skin eruption 01/07/2017    Past Medical History:  Diagnosis Date  . Asthma   . Headache(784.0)   . Pneumonia   . Seasonal allergies   . Sickle cell anemia (HCC)   . Vision abnormalities    Hx: of wears glasses    Past Surgical History:  Procedure Laterality Date  . TOOTH EXTRACTION N/A 05/26/2013   Procedure: EXTRACTION WISDOM TEETH - one, sixteen, seventeen and thirtytwo.;  Surgeon: Georgia Lopes, DDS;  Location: [facility] OR;  Service: Oral Surgery;  Laterality: N/A;    Family History  Problem Relation Age of Onset  . Diabetes Mother   . Hypertension Mother   . Vision loss Mother   . Diabetes Father   . Hypertension Father   . Vision loss Father     Social History   Socioeconomic History  . Marital status: Single    Spouse name: Not on file  . Number of children: Not on file  . Years of education: Not on file  . Highest education level: Not on file  Occupational History  . Not on file  Tobacco Use  . Smoking status: Never Smoker  . Smokeless tobacco: Never Used  Vaping Use  . Vaping Use: Never used  Substance and Sexual Activity  . Alcohol use: No  . Drug use: No  . Sexual activity: Yes  Birth control/protection: Condom  Other Topics Concern  . Not on file  Social History Narrative   Pt is in 11th grade 2014-15   Social Determinants of Health   Financial Resource Strain:   . Difficulty of Paying Living Expenses:   Food Insecurity:   . Worried About Programme researcher, broadcasting/film/video in the Last Year:   . Barista in the Last Year:   Transportation Needs:   . Freight forwarder (Medical):   Marland Kitchen Lack of Transportation (Non-Medical):   Physical Activity:   . Days of Exercise per Week:   . Minutes of Exercise per Session:   Stress:   . Feeling of Stress :   Social Connections:   . Frequency of  Communication with Friends and Family:   . Frequency of Social Gatherings with Friends and Family:   . Attends Religious Services:   . Active Member of Clubs or Organizations:   . Attends Banker Meetings:   Marland Kitchen Marital Status:   Intimate Partner Violence:   . Fear of Current or Ex-Partner:   . Emotionally Abused:   Marland Kitchen Physically Abused:   . Sexually Abused:     Outpatient Medications Prior to Visit  Medication Sig Dispense Refill  . ergocalciferol (VITAMIN D2) 1.25 MG (50000 UT) capsule Take 1 capsule (50,000 Units total) by mouth once a week. X 12 weeks. 12 capsule 0  . ibuprofen (ADVIL) 800 MG tablet Take 1 tablet (800 mg total) by mouth every 8 (eight) hours as needed. 30 tablet 6  . cetirizine (ZYRTEC) 10 MG tablet Take 1 tablet (10 mg total) by mouth daily. (Patient not taking: Reported on 11/24/2019) 30 tablet 11  . folic acid (FOLVITE) 1 MG tablet Take 1 tablet (1 mg total) by mouth daily. (Patient not taking: Reported on 11/24/2019) 30 tablet 6   No facility-administered medications prior to visit.    Allergies  Allergen Reactions  . Mushroom Extract Complex Nausea And Vomiting    ROS Review of Systems  Constitutional: Negative.   HENT: Negative.   Eyes: Negative.   Respiratory: Negative.   Cardiovascular: Negative.   Gastrointestinal: Negative.   Endocrine: Negative.   Genitourinary: Negative.   Musculoskeletal:       Feet pain  Skin: Negative.   Allergic/Immunologic: Negative.   Neurological: Positive for dizziness (occasional) and headaches (occasional ).  Hematological: Negative.   Psychiatric/Behavioral: Negative.       Objective:    Physical Exam Vitals and nursing note reviewed.  Constitutional:      Appearance: Normal appearance.  HENT:     Head: Normocephalic and atraumatic.     Nose: Nose normal.     Mouth/Throat:     Mouth: Mucous membranes are moist.  Cardiovascular:     Rate and Rhythm: Normal rate and regular rhythm.      Pulses: Normal pulses.     Heart sounds: Normal heart sounds.  Pulmonary:     Effort: Pulmonary effort is normal.     Breath sounds: Normal breath sounds.  Abdominal:     General: Bowel sounds are normal.     Palpations: Abdomen is soft.  Musculoskeletal:        General: Normal range of motion.     Cervical back: Normal range of motion and neck supple.  Skin:    General: Skin is warm and dry.  Neurological:     General: No focal deficit present.     Mental Status: He is alert  and oriented to person, place, and time.  Psychiatric:        Mood and Affect: Mood normal.        Behavior: Behavior normal.        Thought Content: Thought content normal.        Judgment: Judgment normal.     BP (!) 128/58 (BP Location: Right Arm, Patient Position: Sitting, Cuff Size: Small)   Pulse (!) 103   Temp 99.7 F (37.6 C)   Wt 157 lb 0.6 oz (71.2 kg)   SpO2 95%   BMI 21.30 kg/m  Wt Readings from Last 3 Encounters:  12/04/19 157 lb 0.6 oz (71.2 kg)  11/24/19 163 lb 12.8 oz (74.3 kg)  06/07/19 160 lb (72.6 kg)     Health Maintenance Due  Topic Date Due  . COVID-19 Vaccine (1) Never done    There are no preventive care reminders to display for this patient.  No results found for: TSH Lab Results  Component Value Date   WBC 8.5 11/24/2019   HGB 8.5 (L) 11/24/2019   HCT 27.8 (L) 11/24/2019   MCV 78 (L) 11/24/2019   PLT 581 (H) 11/24/2019   Lab Results  Component Value Date   NA 140 11/24/2019   K 4.7 11/24/2019   CO2 21 11/24/2019   GLUCOSE 83 11/24/2019   BUN 5 (L) 11/24/2019   CREATININE 0.76 11/24/2019   BILITOT 1.2 11/24/2019   ALKPHOS 95 11/24/2019   AST 43 (H) 11/24/2019   ALT 24 11/24/2019   PROT 8.2 11/24/2019   ALBUMIN 4.5 11/24/2019   CALCIUM 9.6 11/24/2019   No results found for: CHOL No results found for: HDL No results found for: LDLCALC No results found for: TRIG No results found for: CHOLHDL No results found for: HGBA1C    Assessment & Plan:    1. Hb-SS disease without crisis [facility]) He is doing well today r/t his chronic pain management. He will continue to take pain medications as prescribed; will continue to avoid extreme heat and cold; will continue to eat a healthy diet and drink at least 64 ounces of water daily; continue stool softener as needed; will avoid colds and flu; will continue to get plenty of sleep and rest; will continue to avoid high stressful situations and remain infection free; will continue Folic Acid 1 mg daily to avoid sickle cell crisis. Continue to follow up with Hematologist as needed.  - Ambulatory referral to Podiatry - oxyCODONE-acetaminophen (PERCOCET) 5-325 MG tablet; Take 1 tablet by mouth every 8 (eight) hours as needed for severe pain.  Dispense: 45 tablet; Refill: 0  2. Chronic pain syndrome We will initiate Oxycodone today.  - Ambulatory referral to Podiatry - oxyCODONE-acetaminophen (PERCOCET) 5-325 MG tablet; Take 1 tablet by mouth every 8 (eight) hours as needed for severe pain.  Dispense: 45 tablet; Refill: 0  3. Pain of left heel We will refer him to Podiatry today.  - Ambulatory referral to Podiatry - oxyCODONE-acetaminophen (PERCOCET) 5-325 MG tablet; Take 1 tablet by mouth every 8 (eight) hours as needed for severe pain.  Dispense: 45 tablet; Refill: 0  4. Possible exposure to STD - STD Screen (8) - Chlamydia/Gonococcus/Trichomonas, NAA  5. Follow up He will follow up in as needed.   Meds ordered this encounter  Medications  . oxyCODONE-acetaminophen (PERCOCET) 5-325 MG tablet    Sig: Take 1 tablet by mouth every 8 (eight) hours as needed for severe pain.    Dispense:  45 tablet  Refill:  0    Orders Placed This Encounter  Procedures  . Chlamydia/Gonococcus/Trichomonas, NAA  . STD Screen (8)  . Ambulatory referral to Podiatry    Referral Orders     Ambulatory referral to Podiatry   Raliegh Ip,  MSN, FNP-BC [facility] [address] [place], [place] [address] [phone] fax   Problem List Items Addressed This Visit      Other   Chronic pain syndrome   Relevant Medications   oxyCODONE-acetaminophen (PERCOCET) 5-325 MG tablet   Other Relevant Orders   Ambulatory referral to Podiatry   Pain of left heel   Relevant Medications   oxyCODONE-acetaminophen (PERCOCET) 5-325 MG tablet   Other Relevant Orders   Ambulatory referral to Podiatry   Sickle cell anemia (HCC) - Primary   Relevant Medications   oxyCODONE-acetaminophen (PERCOCET) 5-325 MG tablet   Other Relevant Orders   Ambulatory referral to Podiatry    Other Visit Diagnoses    Possible exposure to STD       Relevant Orders   STD Screen (8)   Chlamydia/Gonococcus/Trichomonas, NAA   Follow up          Meds ordered this encounter  Medications  . oxyCODONE-acetaminophen (PERCOCET) 5-325 MG tablet    Sig: Take 1 tablet by mouth every 8 (eight) hours as needed for severe pain.    Dispense:  45 tablet    Refill:  0    Follow-up: No follow-ups on file.    Kallie Locks, FNP

## 2019-12-05 LAB — STD SCREEN (8)
HIV Screen 4th Generation wRfx: NONREACTIVE
HSV 1 Glycoprotein G Ab, IgG: 25.1 index — ABNORMAL HIGH (ref 0.00–0.90)
HSV 2 IgG, Type Spec: <0.91 index (ref 0.00–0.90)
Hep A IgM: NEGATIVE
Hep B C IgM: NEGATIVE
Hep C Virus Ab: <0.1 s/co ratio (ref 0.0–0.9)
Hepatitis B Surface Ag: NEGATIVE
RPR Ser Ql: NONREACTIVE

## 2019-12-06 ENCOUNTER — Ambulatory Visit: Payer: Medicaid Other | Admitting: Family Medicine

## 2019-12-06 NOTE — Progress Notes (Signed)
Patient notified of results, verbally understood.

## 2019-12-07 LAB — CHLAMYDIA/GONOCOCCUS/TRICHOMONAS, NAA
Chlamydia by NAA: NEGATIVE
Gonococcus by NAA: NEGATIVE
Trich vag by NAA: NEGATIVE

## 2019-12-08 ENCOUNTER — Other Ambulatory Visit: Payer: Self-pay | Admitting: Family Medicine

## 2019-12-08 DIAGNOSIS — M79672 Pain in left foot: Secondary | ICD-10-CM

## 2019-12-08 DIAGNOSIS — G894 Chronic pain syndrome: Secondary | ICD-10-CM

## 2019-12-08 DIAGNOSIS — D571 Sickle-cell disease without crisis: Secondary | ICD-10-CM

## 2020-01-16 ENCOUNTER — Telehealth: Payer: Self-pay | Admitting: Family Medicine

## 2020-01-17 ENCOUNTER — Other Ambulatory Visit: Payer: Self-pay | Admitting: Family Medicine

## 2020-01-17 DIAGNOSIS — M79672 Pain in left foot: Secondary | ICD-10-CM

## 2020-01-17 DIAGNOSIS — D571 Sickle-cell disease without crisis: Secondary | ICD-10-CM

## 2020-01-17 DIAGNOSIS — G894 Chronic pain syndrome: Secondary | ICD-10-CM

## 2020-01-17 MED ORDER — OXYCODONE-ACETAMINOPHEN 5-325 MG PO TABS: 1.0000 | ORAL_TABLET | Freq: Three times a day (TID) | ORAL | 0 refills | Status: DC | PRN

## 2020-01-17 NOTE — Telephone Encounter (Signed)
Done

## 2020-02-02 ENCOUNTER — Ambulatory Visit (INDEPENDENT_AMBULATORY_CARE_PROVIDER_SITE_OTHER): Payer: Medicaid Other | Admitting: Podiatry

## 2020-02-02 ENCOUNTER — Other Ambulatory Visit: Payer: Self-pay

## 2020-02-02 ENCOUNTER — Ambulatory Visit (INDEPENDENT_AMBULATORY_CARE_PROVIDER_SITE_OTHER): Payer: Medicaid Other

## 2020-02-02 ENCOUNTER — Ambulatory Visit (INDEPENDENT_AMBULATORY_CARE_PROVIDER_SITE_OTHER): Payer: Medicaid Other | Admitting: Family Medicine

## 2020-02-02 ENCOUNTER — Encounter: Payer: Self-pay | Admitting: Family Medicine

## 2020-02-02 VITALS — BP 147/64 | HR 99 | Temp 98.2°F | Resp 17 | Ht 73.0 in | Wt 159.0 lb

## 2020-02-02 DIAGNOSIS — R0789 Other chest pain: Secondary | ICD-10-CM

## 2020-02-02 DIAGNOSIS — E559 Vitamin D deficiency, unspecified: Secondary | ICD-10-CM

## 2020-02-02 DIAGNOSIS — M79671 Pain in right foot: Secondary | ICD-10-CM

## 2020-02-02 DIAGNOSIS — M722 Plantar fascial fibromatosis: Secondary | ICD-10-CM | POA: Diagnosis not present

## 2020-02-02 DIAGNOSIS — Z09 Encounter for follow-up examination after completed treatment for conditions other than malignant neoplasm: Secondary | ICD-10-CM

## 2020-02-02 DIAGNOSIS — G894 Chronic pain syndrome: Secondary | ICD-10-CM

## 2020-02-02 DIAGNOSIS — D571 Sickle-cell disease without crisis: Secondary | ICD-10-CM

## 2020-02-02 DIAGNOSIS — M79672 Pain in left foot: Secondary | ICD-10-CM

## 2020-02-02 DIAGNOSIS — M216X9 Other acquired deformities of unspecified foot: Secondary | ICD-10-CM

## 2020-02-02 DIAGNOSIS — Z79899 Other long term (current) drug therapy: Secondary | ICD-10-CM

## 2020-02-02 NOTE — Patient Instructions (Signed)

## 2020-02-02 NOTE — Progress Notes (Signed)
[facility]    Established Patient Office Visit  Subjective:  Patient ID: Derrick Wu, male    DOB: 09-28-1995  Age: 24 y.o. MRN: [id number]  CC:  Chief Complaint  Patient presents with  . Follow-up    Pt will like to discuss changing his medication and a sharp pain around his heart. Pt states the pain comes and goes but yesterday it was constant. Pt stated he has not felt the pain today.    HPI Derrick Wu is a 24 year old male who presents for Follow Up today.    Patient Active Problem List   Diagnosis Date Noted  . Pain of left heel 12/04/2019  . Vitamin D deficiency 11/27/2019  . Dental filling status 01/06/2019  . Acute bilateral knee pain 11/13/2018  . Chronic pain syndrome 09/05/2018  . Seasonal allergies 09/05/2018  . Sickle cell anemia (HCC) 01/07/2017  . Rash and nonspecific skin eruption 01/07/2017  . 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 Asthma   . Headache(784.0)   . Pneumonia   . Seasonal allergies   . Sickle cell anemia (HCC)   . Vision abnormalities    Hx: of wears glasses    Past Surgical History:  Procedure Laterality Date  . TOOTH EXTRACTION N/A 05/26/2013   Procedure: EXTRACTION WISDOM TEETH - one, sixteen, seventeen and thirtytwo.;  Surgeon: Georgia Lopes, DDS;  Location: [facility] OR;  Service: Oral Surgery;  Laterality: N/A;   Current Status: Since his last office visit, he is doing well with no complaints. He states that he has pain in his arms, legs, and other joints. He rates his pain today at 3/10. He has not had a hospital visit for Sickle Cell Crisis since 04/19/2019 where he was treated and discharged the same day. He is currently taking all medications as prescribed and staying well hydrated. He reports occasional nausea, constipation, dizziness and headaches. He has been experiencing intermittent chest pain lately. He states  that he has been working extra, running for exercises and weight lifting lately also. He also states that he has been having increased stress lately. His anxiety is stable today. He denies fevers, chills, recent infections, weight loss, and night sweats. He has not had any visual changes, and falls. No chest pain, heart palpitations, cough and shortness of breath reported. Denies GI problems such as diarrhea, and constipation. He has no reports of blood in stools, dysuria and hematuria. He is taking all medications as prescribed.   Family History  Problem Relation Age of Onset  . Diabetes Mother   . Hypertension Mother   . Vision loss Mother   . Diabetes Father   . Hypertension Father   . Vision loss Father     Social History   Socioeconomic History  . Marital status: Single    Spouse name: Not on file  . Number of children: Not on file  . Years of education: Not on file  . Highest education level: Not on file  Occupational History  . Not on file  Tobacco Use  . Smoking status: Never Smoker  . Smokeless tobacco: Never Used  Vaping Use  . Vaping Use: Never used  Substance and Sexual Activity  . Alcohol use: No  . Drug use: No  . Sexual activity: Yes    Birth control/protection: Condom  Other Topics Concern  . Not on file  Social  History Narrative   Pt is in 11th grade 2014-15   Social Determinants of Health   Financial Resource Strain:   . Difficulty of Paying Living Expenses: Not on file  Food Insecurity:   . Worried About Programme researcher, broadcasting/film/video in the Last Year: Not on file  . Ran Out of Food in the Last Year: Not on file  Transportation Needs:   . Lack of Transportation (Medical): Not on file  . Lack of Transportation (Non-Medical): Not on file  Physical Activity:   . Days of Exercise per Week: Not on file  . Minutes of Exercise per Session: Not on file  Stress:   . Feeling of Stress : Not on file  Social Connections:   . Frequency of Communication with Friends and  Family: Not on file  . Frequency of Social Gatherings with Friends and Family: Not on file  . Attends Religious Services: Not on file  . Active Member of Clubs or Organizations: Not on file  . Attends Banker Meetings: Not on file  . Marital Status: Not on file  Intimate Partner Violence:   . Fear of Current or Ex-Partner: Not on file  . Emotionally Abused: Not on file  . Physically Abused: Not on file  . Sexually Abused: Not on file    Outpatient Medications Prior to Visit  Medication Sig Dispense Refill  . cetirizine (ZYRTEC) 10 MG tablet Take 1 tablet (10 mg total) by mouth daily. 30 tablet 11  . ergocalciferol (VITAMIN D2) 1.25 MG (50000 UT) capsule Take 1 capsule (50,000 Units total) by mouth once a week. X 12 weeks. 12 capsule 0  . oxyCODONE-acetaminophen (PERCOCET) 5-325 MG tablet Take 1 tablet by mouth every 8 (eight) hours as needed for severe pain. 45 tablet 0  . doxycycline (VIBRAMYCIN) 100 MG capsule Take 100 mg by mouth 2 (two) times daily. (Patient not taking: Reported on 02/02/2020)    . folic acid (FOLVITE) 1 MG tablet Take 1 tablet (1 mg total) by mouth daily. (Patient not taking: Reported on 02/02/2020) 30 tablet 6  . ibuprofen (ADVIL) 800 MG tablet Take 1 tablet (800 mg total) by mouth every 8 (eight) hours as needed. (Patient not taking: Reported on 02/02/2020) 30 tablet 6   No facility-administered medications prior to visit.    Allergies  Allergen Reactions  . Mushroom Extract Complex Nausea And Vomiting    ROS Review of Systems  Constitutional: Negative.   HENT: Negative.   Eyes: Negative.   Respiratory: Negative.   Cardiovascular: Positive for chest pain (intermittent chest discomfort).  Gastrointestinal: Negative.   Endocrine: Negative.   Genitourinary: Negative.   Musculoskeletal: Positive for arthralgias (generalized joint pain).  Skin: Negative.   Allergic/Immunologic: Negative.   Neurological: Positive for dizziness (occasional) and  headaches (occasional ).  Hematological: Negative.   Psychiatric/Behavioral: Negative.       Objective:    Physical Exam Vitals and nursing note reviewed.  Constitutional:      Appearance: Normal appearance.  HENT:     Head: Normocephalic and atraumatic.     Nose: Nose normal.     Mouth/Throat:     Mouth: Mucous membranes are moist.     Pharynx: Oropharynx is clear.  Cardiovascular:     Rate and Rhythm: Normal rate and regular rhythm.     Pulses: Normal pulses.     Heart sounds: Normal heart sounds.  Pulmonary:     Effort: Pulmonary effort is normal.     Breath  sounds: Normal breath sounds.  Abdominal:     General: Bowel sounds are normal.     Palpations: Abdomen is soft.  Musculoskeletal:        General: Normal range of motion.     Cervical back: Normal range of motion and neck supple.  Skin:    General: Skin is warm and dry.  Neurological:     General: No focal deficit present.     Mental Status: He is alert and oriented to person, place, and time.  Psychiatric:        Mood and Affect: Mood normal.        Behavior: Behavior normal.        Thought Content: Thought content normal.        Judgment: Judgment normal.     BP (!) 147/64 (BP Location: Right Arm, Patient Position: Sitting, Cuff Size: Normal)   Pulse 99   Temp 98.2 F (36.8 C)   Resp 17   Ht 6\' 1"  (1.854 m)   Wt 159 lb (72.1 kg)   SpO2 98%   BMI 20.98 kg/m  Wt Readings from Last 3 Encounters:  02/02/20 159 lb (72.1 kg)  12/04/19 157 lb 0.6 oz (71.2 kg)  11/24/19 163 lb 12.8 oz (74.3 kg)     Health Maintenance Due  Topic Date Due  . COVID-19 Vaccine (1) Never done  . TETANUS/TDAP  Never done  . INFLUENZA VACCINE  01/14/2020    There are no preventive care reminders to display for this patient.  No results found for: TSH Lab Results  Component Value Date   WBC 8.5 11/24/2019   HGB 8.5 (L) 11/24/2019   HCT 27.8 (L) 11/24/2019   MCV 78 (L) 11/24/2019   PLT 581 (H) 11/24/2019   Lab  Results  Component Value Date   NA 140 11/24/2019   K 4.7 11/24/2019   CO2 21 11/24/2019   GLUCOSE 83 11/24/2019   BUN 5 (L) 11/24/2019   CREATININE 0.76 11/24/2019   BILITOT 1.2 11/24/2019   ALKPHOS 95 11/24/2019   AST 43 (H) 11/24/2019   ALT 24 11/24/2019   PROT 8.2 11/24/2019   ALBUMIN 4.5 11/24/2019   CALCIUM 9.6 11/24/2019   No results found for: CHOL No results found for: HDL No results found for: LDLCALC No results found for: TRIG No results found for: CHOLHDL No results found for: 01/24/2020    Assessment & Plan:   1. Hb-SS disease without crisis [facility]) He is doing well today r/t his chronic pain management. He will continue to take pain medications as prescribed; will continue to avoid extreme heat and cold; will continue to eat a healthy diet and drink at least 64 ounces of water daily; continue stool softener as needed; will avoid colds and flu; will continue to get plenty of sleep and rest; will continue to avoid high stressful situations and remain infection free; will continue Folic Acid 1 mg daily to avoid sickle cell crisis. Continue to follow up with Hematologist as needed.  - Urinalysis Dipstick - Sickle Cell Panel  2. Chest discomfort Intermittent. Stable today.   3. Chronic pain syndrome  4. Vitamin D deficiency - Sickle Cell Panel  5. Follow up He will up in 2 months.   No orders of the defined types were placed in this encounter.   Orders Placed This Encounter  Procedures  . Sickle Cell Panel  . Urinalysis Dipstick    Referral Orders  No referral(s) requested today  Raliegh IpNatalie Freja Faro,  MSN, FNP-BC [facility] [address]  [place], [place] [id number] [phone] fax   Problem List Items Addressed This Visit      Other   Chronic pain syndrome   Hb-SS disease without crisis [facility](HCC) - Primary   Relevant Orders   Urinalysis Dipstick    Sickle Cell Panel   Vitamin D deficiency   Relevant Orders   Sickle Cell Panel    Other Visit Diagnoses    Chest discomfort       Pain of right heel       Medication management       Follow up          No orders of the defined types were placed in this encounter.   Follow-up: Return in about 2 months (around 04/03/2020).    Kallie LocksNatalie M Jerrilynn Mikowski, FNP

## 2020-02-02 NOTE — Progress Notes (Signed)
  Subjective:  Patient ID: Derrick Wu, male    DOB: Mar 22, 1996,  MRN: [id number]  Chief Complaint  Patient presents with  . Foot Pain    Bilateral plantar heel pain 5 year duration, severe. Denies any history of injuries.    24 y.o. male presents with the above complaint. History confirmed with patient.   Objective:  Physical Exam: warm, good capillary refill, no trophic changes or ulcerative lesions, normal DP and PT pulses and normal sensory exam. 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 Plantar fasciitis   2. Pain of both heels   3. Equinus deformity of foot      Plan:  Patient was evaluated and treated and all questions answered.  Plantar Fasciitis -XR reviewed with patient -Educated patient on stretching and icing of the affected limb -Injection delivered to the plantar fascia of both feet.  Procedure: Injection Tendon/Ligament Consent: Verbal consent obtained. Location: Bilateral plantar fascia at the glabrous junction; medial approach. Skin Prep: Alcohol. Injectate: 1 cc 0.5% marcaine plain, 1 cc dexamethasone phosphate, 0.5 cc kenalog 10. Disposition: Patient tolerated procedure well. Injection site dressed with a band-aid.    Return in about 1 month (around 03/04/2020) for Plantar fasciitis, Bilateral.

## 2020-02-03 LAB — CMP14+CBC/D/PLT+FER+RETIC+V...
ALT: 29 IU/L (ref 0–44)
AST: 48 IU/L — ABNORMAL HIGH (ref 0–40)
Albumin/Globulin Ratio: 1.3 (ref 1.2–2.2)
Albumin: 4.6 g/dL (ref 4.1–5.2)
Alkaline Phosphatase: 97 IU/L (ref 48–121)
BUN/Creatinine Ratio: 11 (ref 9–20)
BUN: 8 mg/dL (ref 6–20)
Basophils Absolute: 0 10*3/uL (ref 0.0–0.2)
Basos: 0 %
Bilirubin Total: 1.2 mg/dL (ref 0.0–1.2)
CO2: 23 mmol/L (ref 20–29)
Calcium: 10.1 mg/dL (ref 8.7–10.2)
Chloride: 101 mmol/L (ref 96–106)
Creatinine, Ser: 0.7 mg/dL — ABNORMAL LOW (ref 0.76–1.27)
EOS (ABSOLUTE): 0 10*3/uL (ref 0.0–0.4)
Eos: 0 %
Ferritin: 32 ng/mL (ref 30–400)
GFR calc Af Amer: 154 mL/min/{1.73_m2} (ref >59)
GFR calc non Af Amer: 133 mL/min/{1.73_m2} (ref >59)
Globulin, Total: 3.6 g/dL (ref 1.5–4.5)
Glucose: 106 mg/dL — ABNORMAL HIGH (ref 65–99)
Hematocrit: 27.6 % — ABNORMAL LOW (ref 37.5–51.0)
Hemoglobin: 8.5 g/dL — ABNORMAL LOW (ref 13.0–17.7)
Immature Grans (Abs): 0 10*3/uL (ref 0.0–0.1)
Immature Granulocytes: 1 %
Lymphocytes Absolute: 0.7 10*3/uL (ref 0.7–3.1)
Lymphs: 8 %
MCH: 23.9 pg — ABNORMAL LOW (ref 26.6–33.0)
MCHC: 30.8 g/dL — ABNORMAL LOW (ref 31.5–35.7)
MCV: 78 fL — ABNORMAL LOW (ref 79–97)
Monocytes Absolute: 0.1 10*3/uL (ref 0.1–0.9)
Monocytes: 2 %
NRBC: 1 % — ABNORMAL HIGH (ref 0–0)
Neutrophils Absolute: 7.7 10*3/uL — ABNORMAL HIGH (ref 1.4–7.0)
Neutrophils: 89 %
Platelets: 568 10*3/uL — ABNORMAL HIGH (ref 150–450)
Potassium: 4.6 mmol/L (ref 3.5–5.2)
RBC: 3.55 x10E6/uL — ABNORMAL LOW (ref 4.14–5.80)
RDW: 24.4 % — ABNORMAL HIGH (ref 11.6–15.4)
Retic Ct Pct: 7.5 % — ABNORMAL HIGH (ref 0.6–2.6)
Sodium: 135 mmol/L (ref 134–144)
Total Protein: 8.2 g/dL (ref 6.0–8.5)
Vit D, 25-Hydroxy: 38.7 ng/mL (ref 30.0–100.0)
WBC: 8.6 10*3/uL (ref 3.4–10.8)

## 2020-02-04 ENCOUNTER — Encounter: Payer: Self-pay | Admitting: Family Medicine

## 2020-02-08 NOTE — Progress Notes (Signed)
Pt was called @ 3: pm to discuss his lab. Pt stated he understood and will keep his appt with the podiatry.

## 2020-03-01 ENCOUNTER — Ambulatory Visit: Payer: Self-pay | Admitting: Podiatry

## 2020-03-05 ENCOUNTER — Ambulatory Visit (INDEPENDENT_AMBULATORY_CARE_PROVIDER_SITE_OTHER): Payer: Self-pay | Admitting: Podiatry

## 2020-03-05 ENCOUNTER — Other Ambulatory Visit: Payer: Self-pay

## 2020-03-05 DIAGNOSIS — M79672 Pain in left foot: Secondary | ICD-10-CM

## 2020-03-05 DIAGNOSIS — M722 Plantar fascial fibromatosis: Secondary | ICD-10-CM

## 2020-03-05 DIAGNOSIS — M79671 Pain in right foot: Secondary | ICD-10-CM

## 2020-03-05 NOTE — Progress Notes (Signed)
  Subjective:  Patient ID: Derrick Wu, male    DOB: 1995/07/04,  MRN: [id number]  Chief Complaint  Patient presents with  . Plantar Fasciitis    Pt states pain is greatly resolved, but still experiences some after standing for long periods of time.   . Foot Orthotics    Pt states interest in custom orthotics.    24 y.o. male presents with the above complaint. History confirmed with patient.   Objective:  Physical Exam: warm, good capillary refill, no trophic changes or ulcerative lesions, normal DP and PT pulses and normal sensory exam. 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 Plantar fasciitis   2. Pain of both heels     Plan:  Patient was evaluated and treated and all questions answered.  Plantar Fasciitis -Improving. No injection today -Continue stretching and icing. -Offfered CMOs, non covered. Declined. Pt would like to do them when his insurance changes through work. -F/u PRN   Return if symptoms worsen or fail to improve.

## 2020-03-12 ENCOUNTER — Other Ambulatory Visit: Payer: Self-pay

## 2020-03-12 ENCOUNTER — Ambulatory Visit (HOSPITAL_COMMUNITY)
Admission: EM | Admit: 2020-03-12 | Discharge: 2020-03-12 | Disposition: A | Discharge status: Home / Self Care | Payer: HRSA Program | Attending: Internal Medicine | Admitting: Internal Medicine

## 2020-03-12 DIAGNOSIS — Z20822 Contact with and (suspected) exposure to covid-19: Secondary | ICD-10-CM | POA: Diagnosis not present

## 2020-03-12 DIAGNOSIS — Z1152 Encounter for screening for COVID-19: Secondary | ICD-10-CM | POA: Diagnosis present

## 2020-03-12 NOTE — Discharge Instructions (Signed)

## 2020-03-12 NOTE — ED Triage Notes (Signed)
Pt presents for covid test for Travel.

## 2020-03-13 LAB — SARS CORONAVIRUS 2 (TAT 6-24 HRS): SARS Coronavirus 2: NEGATIVE

## 2020-03-26 ENCOUNTER — Telehealth: Payer: Self-pay | Admitting: Family Medicine

## 2020-03-26 ENCOUNTER — Other Ambulatory Visit: Payer: Self-pay | Admitting: Family Medicine

## 2020-03-26 DIAGNOSIS — D571 Sickle-cell disease without crisis: Secondary | ICD-10-CM

## 2020-03-26 DIAGNOSIS — M79672 Pain in left foot: Secondary | ICD-10-CM

## 2020-03-26 DIAGNOSIS — G894 Chronic pain syndrome: Secondary | ICD-10-CM

## 2020-03-26 MED ORDER — OXYCODONE-ACETAMINOPHEN 5-325 MG PO TABS: 1.0000 | ORAL_TABLET | Freq: Three times a day (TID) | ORAL | 0 refills | Status: DC | PRN

## 2020-03-26 NOTE — Telephone Encounter (Signed)
Done

## 2020-04-02 ENCOUNTER — Ambulatory Visit: Payer: Self-pay | Admitting: Family Medicine

## 2020-04-03 ENCOUNTER — Ambulatory Visit (INDEPENDENT_AMBULATORY_CARE_PROVIDER_SITE_OTHER): Payer: Medicaid Other | Admitting: Family Medicine

## 2020-04-03 ENCOUNTER — Encounter: Payer: Self-pay | Admitting: Family Medicine

## 2020-04-03 ENCOUNTER — Other Ambulatory Visit: Payer: Self-pay

## 2020-04-03 VITALS — BP 112/55 | HR 85 | Temp 98.7°F | Ht 72.0 in | Wt 153.4 lb

## 2020-04-03 DIAGNOSIS — G894 Chronic pain syndrome: Secondary | ICD-10-CM

## 2020-04-03 DIAGNOSIS — D571 Sickle-cell disease without crisis: Secondary | ICD-10-CM | POA: Diagnosis not present

## 2020-04-03 DIAGNOSIS — Z09 Encounter for follow-up examination after completed treatment for conditions other than malignant neoplasm: Secondary | ICD-10-CM | POA: Diagnosis not present

## 2020-04-03 NOTE — Progress Notes (Signed)
[facility]    Established Patient Office Visit  Subjective:  Patient ID: Derrick Wu, male    DOB: [birth year]  Age: 24 y.o. MRN: [id number]  CC:  Chief Complaint  Patient presents with  . Follow-up    pT STATES LASK WEEK HE WAS HAVING A BAD CRAMP IN HIS STOMACH. Pt states last week when pt inhaled and exhaled he felt like his chest or lungs.. Pt states his mother did some old school medication and he feels better now.    HPI Derrick Wu is a 24 year old male who presents for Follow Up today.   Patient Active Problem List   Diagnosis Date Noted  . Pain of left heel 12/04/2019  . Vitamin D deficiency 11/27/2019  . Dental filling status 01/06/2019  . Acute bilateral knee pain 11/13/2018  . Chronic pain syndrome 09/05/2018  . Seasonal allergies 09/05/2018  . Sickle cell anemia (HCC) 01/07/2017  . Rash and nonspecific skin eruption 01/07/2017  . 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 He states that he has pain in his arms and legs. He rates his pain today at 5/10. He has not had a hospital visit for Sickle Cell Crisis since 04/18/2020 where he was treated and discharged the same day. He is currently taking all medications as prescribed and staying well hydrated. He reports occasional nausea, constipation, dizziness and headaches. He denies fevers, chills, fatigue, recent infections, weight loss, and night sweats. He has not had any visual changes, and falls. No chest pain, heart palpitations, cough and shortness of breath reported. Denies GI problems such as vomiting, diarrhea. He has no reports of blood in stools, dysuria and hematuria. No depression or anxiety reported today. He is taking all medications as prescribed.   Past Medical History:  Diagnosis Date  . Asthma   . Headache(784.0)   . Pneumonia   . Seasonal allergies    . Sickle cell anemia (HCC)   . Vision abnormalities    Hx: of wears glasses    Past Surgical History:  Procedure Laterality Date  . TOOTH EXTRACTION N/A 05/26/2013   Procedure: EXTRACTION WISDOM TEETH - one, sixteen, seventeen and thirtytwo.;  Surgeon: Georgia Lopes, DDS;  Location: [facility] OR;  Service: Oral Surgery;  Laterality: N/A;    Family History  Problem Relation Age of Onset  . Diabetes Mother   . Hypertension Mother   . Vision loss Mother   . Diabetes Father   . Hypertension Father   . Vision loss Father     Social History   Socioeconomic History  . Marital status: Single    Spouse name: Not on file  . Number of children: Not on file  . Years of education: Not on file  . Highest education level: Not on file  Occupational History  . Not on file  Tobacco Use  . Smoking status: Never Smoker  . Smokeless tobacco: Never Used  Vaping Use  . Vaping Use: Never used  Substance and Sexual Activity  . Alcohol use: No  . Drug use: No  . Sexual activity: Yes    Birth control/protection: Condom  Other Topics Concern  . Not on file  Social History Narrative   Pt is in 11th grade 2014-15   Social Determinants of Health   Financial Resource Strain:   . Difficulty of Paying Living Expenses: Not  on file  Food Insecurity:   . Worried About Programme researcher, broadcasting/film/video in the Last Year: Not on file  . Ran Out of Food in the Last Year: Not on file  Transportation Needs:   . Lack of Transportation (Medical): Not on file  . Lack of Transportation (Non-Medical): Not on file  Physical Activity:   . Days of Exercise per Week: Not on file  . Minutes of Exercise per Session: Not on file  Stress:   . Feeling of Stress : Not on file  Social Connections:   . Frequency of Communication with Friends and Family: Not on file  . Frequency of Social Gatherings with Friends and Family: Not on file  . Attends Religious Services: Not on file  . Active Member of Clubs or Organizations: Not on  file  . Attends Banker Meetings: Not on file  . Marital Status: Not on file  Intimate Partner Violence:   . Fear of Current or Ex-Partner: Not on file  . Emotionally Abused: Not on file  . Physically Abused: Not on file  . Sexually Abused: Not on file    Outpatient Medications Prior to Visit  Medication Sig Dispense Refill  . cetirizine (ZYRTEC) 10 MG tablet Take 1 tablet (10 mg total) by mouth daily. 30 tablet 11  . doxycycline (VIBRAMYCIN) 100 MG capsule Take 100 mg by mouth 2 (two) times daily.     . folic acid (FOLVITE) 1 MG tablet Take 1 tablet (1 mg total) by mouth daily. 30 tablet 6  . ibuprofen (ADVIL) 800 MG tablet Take 1 tablet (800 mg total) by mouth every 8 (eight) hours as needed. 30 tablet 6  . oxyCODONE-acetaminophen (PERCOCET) 5-325 MG tablet Take 1 tablet by mouth every 8 (eight) hours as needed for severe pain. 45 tablet 0   No facility-administered medications prior to visit.    Allergies  Allergen Reactions  . Mushroom Extract Complex Nausea And Vomiting    ROS Review of Systems  Constitutional: Negative.   HENT: Negative.   Eyes: Negative.   Respiratory: Negative.   Cardiovascular: Negative.   Gastrointestinal: Positive for constipation (occasional ) and nausea (occasional).  Endocrine: Negative.   Genitourinary: Negative.   Musculoskeletal: Positive for arthralgias (generalized joint pain).  Skin: Negative.   Allergic/Immunologic: Negative.   Neurological: Positive for dizziness (occasional ) and headaches (occasional ).  Hematological: Negative.   Psychiatric/Behavioral: Negative.       Objective:    Physical Exam Vitals and nursing note reviewed.  Constitutional:      Appearance: Normal appearance.  HENT:     Head: Normocephalic and atraumatic.     Nose: Nose normal.     Mouth/Throat:     Mouth: Mucous membranes are moist.     Pharynx: Oropharynx is clear.  Cardiovascular:     Rate and Rhythm: Normal rate and regular  rhythm.     Pulses: Normal pulses.     Heart sounds: Normal heart sounds.  Pulmonary:     Effort: Pulmonary effort is normal.     Breath sounds: Normal breath sounds.  Abdominal:     General: Bowel sounds are normal.     Palpations: Abdomen is soft.  Musculoskeletal:        General: Normal range of motion.     Cervical back: Normal range of motion and neck supple.  Skin:    General: Skin is warm and dry.  Neurological:     General: No focal deficit present.  Mental Status: He is alert and oriented to person, place, and time.  Psychiatric:        Mood and Affect: Mood normal.        Behavior: Behavior normal.        Thought Content: Thought content normal.        Judgment: Judgment normal.     BP (!) 112/55 (BP Location: Left Arm, Patient Position: Sitting, Cuff Size: Normal)   Pulse 85   Temp 98.7 F (37.1 C)   Ht 6' (1.829 m)   Wt 153 lb 6.4 oz (69.6 kg)   BMI 20.80 kg/m  Wt Readings from Last 3 Encounters:  04/03/20 153 lb 6.4 oz (69.6 kg)  02/02/20 159 lb (72.1 kg)  12/04/19 157 lb 0.6 oz (71.2 kg)     Health Maintenance Due  Topic Date Due  . COVID-19 Vaccine (1) Never done  . TETANUS/TDAP  Never done  . INFLUENZA VACCINE  Never done    There are no preventive care reminders to display for this patient.  No results found for: TSH Lab Results  Component Value Date   WBC 8.6 02/02/2020   HGB 8.5 (L) 02/02/2020   HCT 27.6 (L) 02/02/2020   MCV 78 (L) 02/02/2020   PLT 568 (H) 02/02/2020   Lab Results  Component Value Date   NA 135 02/02/2020   K 4.6 02/02/2020   CO2 23 02/02/2020   GLUCOSE 106 (H) 02/02/2020   BUN 8 02/02/2020   CREATININE 0.70 (L) 02/02/2020   BILITOT 1.2 02/02/2020   ALKPHOS 97 02/02/2020   AST 48 (H) 02/02/2020   ALT 29 02/02/2020   PROT 8.2 02/02/2020   ALBUMIN 4.6 02/02/2020   CALCIUM 10.1 02/02/2020   No results found for: CHOL No results found for: HDL No results found for: LDLCALC No results found for: TRIG No  results found for: CHOLHDL No results found for: YSAY3K    Assessment & Plan:   1. Hb-SS disease without crisis [facility]) He is doing well today r/t his chronic pain management. He will continue to take pain medications as prescribed; will continue to avoid extreme heat and cold; will continue to eat a healthy diet and drink at least 64 ounces of water daily; continue stool softener as needed; will avoid colds and flu; will continue to get plenty of sleep and rest; will continue to avoid high stressful situations and remain infection free; will continue Folic Acid 1 mg daily to avoid sickle cell crisis. Continue to follow up with Hematologist as needed.   2. Chronic pain syndrome  3. Follow up He will follow up in 2 months.   No orders of the defined types were placed in this encounter.   No orders of the defined types were placed in this encounter.   Referral Orders  No referral(s) requested today    Raliegh Ip,  MSN, FNP-BC [facility] [address] [place], [place] [address] [phone]- fax  Problem List Items Addressed This Visit      Other   Chronic pain syndrome   Hb-SS disease without crisis (HCC) - Primary    Other Visit Diagnoses    Follow up          No orders of the defined types were placed in this encounter.   Follow-up: Return in about 3 months (around 07/04/2020).    Kallie Locks, FNP

## 2020-04-08 ENCOUNTER — Encounter: Payer: Self-pay | Admitting: Family Medicine

## 2020-04-09 ENCOUNTER — Telehealth: Payer: Self-pay

## 2020-04-16 ENCOUNTER — Other Ambulatory Visit: Payer: Self-pay | Admitting: Family Medicine

## 2020-04-16 DIAGNOSIS — G894 Chronic pain syndrome: Secondary | ICD-10-CM

## 2020-04-16 DIAGNOSIS — D571 Sickle-cell disease without crisis: Secondary | ICD-10-CM

## 2020-04-16 DIAGNOSIS — M79672 Pain in left foot: Secondary | ICD-10-CM

## 2020-04-16 MED ORDER — OXYCODONE-ACETAMINOPHEN 5-325 MG PO TABS: 1.0000 | ORAL_TABLET | Freq: Three times a day (TID) | ORAL | 0 refills | Status: DC | PRN

## 2020-04-25 ENCOUNTER — Other Ambulatory Visit: Payer: Self-pay | Admitting: Family Medicine

## 2020-04-25 DIAGNOSIS — D571 Sickle-cell disease without crisis: Secondary | ICD-10-CM

## 2020-04-25 MED ORDER — OXYCODONE HCL 5 MG PO TABS: 5.0000 mg | ORAL_TABLET | Freq: Three times a day (TID) | ORAL | 0 refills | Status: DC | PRN

## 2020-04-29 ENCOUNTER — Encounter: Payer: Self-pay | Admitting: Family Medicine

## 2020-04-30 ENCOUNTER — Other Ambulatory Visit: Payer: Self-pay | Admitting: Family Medicine

## 2020-04-30 DIAGNOSIS — G894 Chronic pain syndrome: Secondary | ICD-10-CM

## 2020-04-30 DIAGNOSIS — D571 Sickle-cell disease without crisis: Secondary | ICD-10-CM

## 2020-04-30 MED ORDER — TIZANIDINE HCL 4 MG PO CAPS: 4.0000 mg | ORAL_CAPSULE | Freq: Three times a day (TID) | ORAL | 3 refills | Status: DC

## 2020-05-03 NOTE — Telephone Encounter (Signed)
Pt states she understood

## 2020-05-06 ENCOUNTER — Other Ambulatory Visit: Payer: Self-pay

## 2020-05-06 ENCOUNTER — Ambulatory Visit (INDEPENDENT_AMBULATORY_CARE_PROVIDER_SITE_OTHER): Payer: Medicaid Other | Admitting: Family Medicine

## 2020-05-06 ENCOUNTER — Encounter: Payer: Self-pay | Admitting: Family Medicine

## 2020-05-06 VITALS — BP 128/54 | Temp 98.2°F | Ht 73.0 in | Wt 158.6 lb

## 2020-05-06 DIAGNOSIS — Z0000 Encounter for general adult medical examination without abnormal findings: Secondary | ICD-10-CM

## 2020-05-06 DIAGNOSIS — G894 Chronic pain syndrome: Secondary | ICD-10-CM | POA: Diagnosis not present

## 2020-05-06 DIAGNOSIS — M79672 Pain in left foot: Secondary | ICD-10-CM

## 2020-05-06 DIAGNOSIS — D571 Sickle-cell disease without crisis: Secondary | ICD-10-CM | POA: Diagnosis not present

## 2020-05-06 DIAGNOSIS — J029 Acute pharyngitis, unspecified: Secondary | ICD-10-CM

## 2020-05-06 DIAGNOSIS — Z09 Encounter for follow-up examination after completed treatment for conditions other than malignant neoplasm: Secondary | ICD-10-CM

## 2020-05-06 LAB — POCT URINALYSIS DIPSTICK
Bilirubin, UA: NEGATIVE
Blood, UA: NEGATIVE
Glucose, UA: NEGATIVE
Ketones, UA: NEGATIVE
Leukocytes, UA: NEGATIVE
Nitrite, UA: NEGATIVE
Protein, UA: NEGATIVE
Spec Grav, UA: 1.025 (ref 1.010–1.025)
Urobilinogen, UA: 0.2 E.U./dL
pH, UA: 7 (ref 5.0–8.0)

## 2020-05-06 NOTE — Progress Notes (Signed)
[facility]   Annual Physical  Subjective:  Patient ID: Derrick Wu, male    DOB: Oct 31, 1995  Age: 24 y.o. MRN: [id number]  CC:  Chief Complaint  Patient presents with  . Employment Physical    HPI Derrick Wu is a 24 male who presents for Follow Up today.    Patient Active Problem List   Diagnosis Date Noted  . Pain of left heel 12/04/2019  . Vitamin D deficiency 11/27/2019  . Dental filling status 01/06/2019  . Acute bilateral knee pain 11/13/2018  . Chronic pain syndrome 09/05/2018  . Seasonal allergies 09/05/2018  . Sickle cell anemia (HCC) 01/07/2017  . Rash and nonspecific skin eruption 01/07/2017  . 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 He states that he has pain in his arms, legs, and other joints. He rates his pain today at 5/10. He has not had a hospital visit for Sickle Cell Crisis for many years. He is currently taking all medications as prescribed and staying well hydrated. He reports occasional nausea, constipation, dizziness and headaches. He has not received refill of Percocet because of Prior Authorization issues. He states that he is using muscle rub to aid in his pain.  He states that he has had a sore throat X 1 week now, which he has been drinking a OTC special herbal tea. He denies fevers, chills, recent infections, weight loss, and night sweats. He has not had any visual changes, and falls. No chest pain, heart palpitations, cough and shortness of breath reported. Denies GI problems such as vomiting, and diarrhea. He has no reports of blood in stools, dysuria and hematuria. No depression or anxiety report. He is taking all medications as prescribed. He denies pain today.   Past Medical History:  Diagnosis Date  . Asthma   . Headache(784.0)   . Pneumonia   . Seasonal allergies   . Sickle cell  anemia (HCC)   . Vision abnormalities    Hx: of wears glasses    Past Surgical History:  Procedure Laterality Date  . TOOTH EXTRACTION N/A 05/26/2013   Procedure: EXTRACTION WISDOM TEETH - one, sixteen, [age] and [age].;  Surgeon: Georgia Lopes, DDS;  Location: [facility];  Service: Oral Surgery;  Laterality: N/A;    Family History  Problem Relation Age of Onset  . Diabetes Mother   . Hypertension Mother   . Vision loss Mother   . Diabetes Father   . Hypertension Father   . Vision loss Father     Social History   Socioeconomic History  . Marital status: Single    Spouse name: Not on file  . Number of children: Not on file  . Years of education: Not on file  . Highest education level: Not on file  Occupational History  . Not on file  Tobacco Use  . Smoking status: Never Smoker  . Smokeless tobacco: Never Used  Vaping Use  . Vaping Use: Never used  Substance and Sexual Activity  . Alcohol use: No  . Drug use: No  . Sexual activity: Yes    Birth control/protection: Condom  Other Topics Concern  . Not on file  Social History Narrative   Pt is in 11th grade 2014-15   Social Determinants of Health   Financial Resource Strain:   . Difficulty of Paying Living Expenses: Not on file  Food Insecurity:   . Worried About Programme researcher, broadcasting/film/video in the Last Year: Not on file  . Ran Out of Food in the Last Year: Not on file  Transportation Needs:   . Lack of Transportation (Medical): Not on file  . Lack of Transportation (Non-Medical): Not on file  Physical Activity:   . Days of Exercise per Week: Not on file  . Minutes of Exercise per Session: Not on file  Stress:   . Feeling of Stress : Not on file  Social Connections:   . Frequency of Communication with Friends and Family: Not on file  . Frequency of Social Gatherings with Friends and Family: Not on file  . Attends Religious Services: Not on file  . Active Member of Clubs or Organizations: Not on file  . Attends  Banker Meetings: Not on file  . Marital Status: Not on file  Intimate Partner Violence:   . Fear of Current or Ex-Partner: Not on file  . Emotionally Abused: Not on file  . Physically Abused: Not on file  . Sexually Abused: Not on file    Outpatient Medications Prior to Visit  Medication Sig Dispense Refill  . cetirizine (ZYRTEC) 10 MG tablet Take 1 tablet (10 mg total) by mouth daily. 30 tablet 11  . doxycycline (VIBRAMYCIN) 100 MG capsule Take 100 mg by mouth 2 (two) times daily.     . folic acid (FOLVITE) 1 MG tablet Take 1 tablet (1 mg total) by mouth daily. 30 tablet 6  . ibuprofen (ADVIL) 800 MG tablet Take 1 tablet (800 mg total) by mouth every 8 (eight) hours as needed. 30 tablet 6  . oxyCODONE (ROXICODONE) 5 MG immediate release tablet Take 1 tablet (5 mg total) by mouth every 8 (eight) hours as needed for severe pain. 30 tablet 0  . oxyCODONE-acetaminophen (PERCOCET) 5-325 MG tablet Take 1 tablet by mouth every 8 (eight) hours as needed for severe pain. 45 tablet 0  . tiZANidine (ZANAFLEX) 4 MG capsule Take 1 capsule (4 mg total) by mouth 3 (three) times daily. 90 capsule 3   No facility-administered medications prior to visit.    Allergies  Allergen Reactions  . Mushroom Extract Complex Nausea And Vomiting    ROS Review of Systems  Constitutional: Negative.   HENT: Positive for sore throat.   Eyes: Negative.   Respiratory: Negative.   Cardiovascular: Negative.   Gastrointestinal: Negative.   Endocrine: Negative.   Genitourinary: Negative.   Musculoskeletal: Positive for arthralgias (generalized joint pain).  Skin: Negative.   Allergic/Immunologic: Negative.   Neurological: Positive for dizziness (occasional ) and headaches (occasional).  Hematological: Negative.   Psychiatric/Behavioral: Negative.     Objective:    Physical Exam Vitals and nursing note reviewed.  Constitutional:      Appearance: Normal appearance.  HENT:     Head:  Normocephalic and atraumatic.     Mouth/Throat:     Mouth: Mucous membranes are moist.     Pharynx: Posterior oropharyngeal erythema present.     Comments: Swollen, painful Cardiovascular:     Rate and Rhythm: Normal rate and regular rhythm.     Pulses: Normal pulses.     Heart sounds: Normal heart sounds.  Pulmonary:     Effort: Pulmonary effort is normal.     Breath sounds: Normal breath sounds.  Abdominal:     General: Bowel sounds are normal.     Palpations: Abdomen is soft.  Musculoskeletal:  General: Normal range of motion.     Cervical back: Normal range of motion and neck supple.  Skin:    General: Skin is warm and dry.  Neurological:     General: No focal deficit present.     Mental Status: He is alert and oriented to person, place, and time.  Psychiatric:        Mood and Affect: Mood normal.        Behavior: Behavior normal.        Thought Content: Thought content normal.        Judgment: Judgment normal.     BP (!) 128/54 (BP Location: Left Arm, Patient Position: Sitting, Cuff Size: Normal)   Temp 98.2 F (36.8 C)   Ht 6\' 1"  (1.854 m)   Wt 158 lb 9.6 oz (71.9 kg)   SpO2 98%   BMI 20.92 kg/m  Wt Readings from Last 3 Encounters:  05/06/20 158 lb 9.6 oz (71.9 kg)  04/03/20 153 lb 6.4 oz (69.6 kg)  02/02/20 159 lb (72.1 kg)     Health Maintenance Due  Topic Date Due  . COVID-19 Vaccine (1) Never done  . TETANUS/TDAP  Never done  . INFLUENZA VACCINE  Never done    There are no preventive care reminders to display for this patient.  No results found for: TSH Lab Results  Component Value Date   WBC 8.6 02/02/2020   HGB 8.5 (L) 02/02/2020   HCT 27.6 (L) 02/02/2020   MCV 78 (L) 02/02/2020   PLT 568 (H) 02/02/2020   Lab Results  Component Value Date   NA 135 02/02/2020   K 4.6 02/02/2020   CO2 23 02/02/2020   GLUCOSE 106 (H) 02/02/2020   BUN 8 02/02/2020   CREATININE 0.70 (L) 02/02/2020   BILITOT 1.2 02/02/2020   ALKPHOS 97 02/02/2020    AST 48 (H) 02/02/2020   ALT 29 02/02/2020   PROT 8.2 02/02/2020   ALBUMIN 4.6 02/02/2020   CALCIUM 10.1 02/02/2020   No results found for: CHOL No results found for: HDL No results found for: LDLCALC No results found for: TRIG No results found for: CHOLHDL No results found for: [id number]  Assessment & Plan:   1. Annual physical exam Physical assessment within normal for age.  Recommend monthly testicular exam Recommend daily multivitamin for men Recommend strength training in 150 minutes of cardiovascular exercise per week  2. Hb-SS disease without crisis [facility](HCC) He is doing well today r/t his chronic pain management. He will continue to take pain medications as prescribed; will continue to avoid extreme heat and cold; will continue to eat a healthy diet and drink at least 64 ounces of water daily; continue stool softener as needed; will avoid colds and flu; will continue to get plenty of sleep and rest; will continue to avoid high stressful situations and remain infection free; will continue Folic Acid 1 mg daily to avoid sickle cell crisis. Continue to follow up with Hematologist as needed.  - Urinalysis Dipstick  3. Chronic pain syndrome  4. Pain of left heel He will try plantar fasciitis socks to aid in foot pain. He will follow up with Podiatry as needed.   5. Sore throat  6. Follow up He will follow up in 2 months.   No orders of the defined types were placed in this encounter.   Orders Placed This Encounter  Procedures  . Urinalysis Dipstick    Referral Orders  No referral(s) requested today  Raliegh Ip,  MSN, FNP-BC [place] [facility] [address] [place], [place] [address] [phone] fax   Problem List Items Addressed This Visit      Other   Chronic pain syndrome   Hb-SS disease without crisis [facility])   Relevant Orders   Urinalysis Dipstick (Completed)    Pain of left heel    Other Visit Diagnoses    Annual physical exam    -  Primary   Sore throat       Follow up          No orders of the defined types were placed in this encounter.   Follow-up: No follow-ups on file.    Kallie Locks, FNP

## 2020-05-07 ENCOUNTER — Encounter: Payer: Self-pay | Admitting: Family Medicine

## 2020-05-13 ENCOUNTER — Other Ambulatory Visit: Payer: Self-pay | Admitting: Family Medicine

## 2020-05-13 DIAGNOSIS — M79672 Pain in left foot: Secondary | ICD-10-CM

## 2020-05-13 DIAGNOSIS — G894 Chronic pain syndrome: Secondary | ICD-10-CM

## 2020-05-13 DIAGNOSIS — D571 Sickle-cell disease without crisis: Secondary | ICD-10-CM

## 2020-05-13 MED ORDER — OXYCODONE-ACETAMINOPHEN 5-325 MG PO TABS: 1.0000 | ORAL_TABLET | Freq: Three times a day (TID) | ORAL | 0 refills | Status: DC | PRN

## 2020-05-13 MED ORDER — TIZANIDINE HCL 4 MG PO CAPS: 4.0000 mg | ORAL_CAPSULE | Freq: Three times a day (TID) | ORAL | 3 refills | Status: DC

## 2020-05-16 ENCOUNTER — Telehealth: Payer: Self-pay

## 2020-05-24 ENCOUNTER — Other Ambulatory Visit: Payer: Self-pay

## 2020-05-24 DIAGNOSIS — D571 Sickle-cell disease without crisis: Secondary | ICD-10-CM

## 2020-05-24 DIAGNOSIS — G894 Chronic pain syndrome: Secondary | ICD-10-CM

## 2020-05-24 MED ORDER — TIZANIDINE HCL 4 MG PO TABS: 4.0000 mg | ORAL_TABLET | Freq: Three times a day (TID) | ORAL | 3 refills | Status: DC

## 2020-05-24 NOTE — Progress Notes (Signed)
Tizanidine changed to tablet

## 2020-05-31 ENCOUNTER — Telehealth: Payer: Self-pay

## 2020-05-31 ENCOUNTER — Other Ambulatory Visit: Payer: Self-pay | Admitting: Family Medicine

## 2020-05-31 DIAGNOSIS — G894 Chronic pain syndrome: Secondary | ICD-10-CM

## 2020-05-31 DIAGNOSIS — D571 Sickle-cell disease without crisis: Secondary | ICD-10-CM

## 2020-05-31 MED ORDER — TIZANIDINE HCL 4 MG PO TABS: 4.0000 mg | ORAL_TABLET | Freq: Three times a day (TID) | ORAL | 6 refills | Status: DC

## 2020-05-31 NOTE — Telephone Encounter (Signed)
-----   Message from Kallie Locks, FNP sent at 05/31/2020 11:14 AM EST ----- Regarding: "Tizanidine" Patient has been waiting on Prior Approval. Changed Rx from capsules to tablets. Please inform patient that Rx for Tizanidine (Zanaflex) was sent today. Sent to both pharmacies, [facility] [place] and [place]. Thank you!

## 2020-05-31 NOTE — Telephone Encounter (Signed)
Called and detail voicemail to inform pt that his medication has been corrected and sent both pharmacy per his request. Also sent a My Chart Message informing pt of this also.

## 2020-06-11 ENCOUNTER — Ambulatory Visit: Payer: Self-pay

## 2020-06-12 ENCOUNTER — Telehealth: Payer: Self-pay | Admitting: Family Medicine

## 2020-06-12 ENCOUNTER — Other Ambulatory Visit: Payer: Self-pay

## 2020-06-12 ENCOUNTER — Other Ambulatory Visit: Payer: Self-pay | Admitting: Family Medicine

## 2020-06-12 ENCOUNTER — Other Ambulatory Visit: Payer: Medicaid Other

## 2020-06-12 DIAGNOSIS — Z20822 Contact with and (suspected) exposure to covid-19: Secondary | ICD-10-CM

## 2020-06-12 DIAGNOSIS — M79672 Pain in left foot: Secondary | ICD-10-CM

## 2020-06-12 DIAGNOSIS — D571 Sickle-cell disease without crisis: Secondary | ICD-10-CM

## 2020-06-12 DIAGNOSIS — G894 Chronic pain syndrome: Secondary | ICD-10-CM

## 2020-06-12 MED ORDER — OXYCODONE-ACETAMINOPHEN 5-325 MG PO TABS: 1.0000 | ORAL_TABLET | Freq: Three times a day (TID) | ORAL | 0 refills | Status: DC | PRN

## 2020-06-12 NOTE — Telephone Encounter (Signed)
Done

## 2020-06-13 LAB — SARS-COV-2, NAA 2 DAY TAT

## 2020-06-13 LAB — NOVEL CORONAVIRUS, NAA: SARS-CoV-2, NAA: NOT DETECTED

## 2020-06-27 ENCOUNTER — Other Ambulatory Visit: Payer: Self-pay | Admitting: Podiatry

## 2020-06-27 DIAGNOSIS — M722 Plantar fascial fibromatosis: Secondary | ICD-10-CM

## 2020-07-18 ENCOUNTER — Telehealth: Payer: Self-pay | Admitting: Family Medicine

## 2020-07-19 ENCOUNTER — Other Ambulatory Visit: Payer: Self-pay | Admitting: Family Medicine

## 2020-07-19 ENCOUNTER — Telehealth: Payer: Self-pay | Admitting: Family Medicine

## 2020-07-19 DIAGNOSIS — G894 Chronic pain syndrome: Secondary | ICD-10-CM

## 2020-07-19 DIAGNOSIS — D571 Sickle-cell disease without crisis: Secondary | ICD-10-CM

## 2020-07-19 DIAGNOSIS — M79672 Pain in left foot: Secondary | ICD-10-CM

## 2020-07-19 MED ORDER — OXYCODONE-ACETAMINOPHEN 5-325 MG PO TABS: 1.0000 | ORAL_TABLET | Freq: Three times a day (TID) | ORAL | 0 refills | Status: DC | PRN

## 2020-07-19 MED ORDER — FOLIC ACID 1 MG PO TABS: 1.0000 mg | ORAL_TABLET | Freq: Every day | ORAL | 3 refills | Status: DC

## 2020-07-19 NOTE — Telephone Encounter (Signed)
Done

## 2020-07-23 NOTE — Progress Notes (Signed)
(  Key: [id number]) - [id number] Need help? Call us at [phone] Status Sent to [employer] The plan will fax you a determination, typically within 1 to 5 business days.  How do I follow up? Drug Folic Acid 1MG  tablets Form [facility] Prior Approval Request for Standard Drug Request Form Pharmacy Prior Approval Request for Standard Drug Request Form for [place] Medicaid and  Health Choice [phone]

## 2020-07-24 ENCOUNTER — Other Ambulatory Visit: Payer: Self-pay | Admitting: Family Medicine

## 2020-07-24 DIAGNOSIS — M79672 Pain in left foot: Secondary | ICD-10-CM

## 2020-07-24 DIAGNOSIS — D571 Sickle-cell disease without crisis: Secondary | ICD-10-CM

## 2020-07-24 DIAGNOSIS — G894 Chronic pain syndrome: Secondary | ICD-10-CM

## 2020-07-24 MED ORDER — OXYCODONE-ACETAMINOPHEN 5-325 MG PO TABS: 1.0000 | ORAL_TABLET | Freq: Three times a day (TID) | ORAL | 0 refills | Status: DC | PRN

## 2020-09-02 ENCOUNTER — Telehealth: Payer: Self-pay

## 2020-09-02 NOTE — Telephone Encounter (Signed)
Refill All medications

## 2020-09-03 ENCOUNTER — Other Ambulatory Visit: Payer: Self-pay

## 2020-09-03 ENCOUNTER — Other Ambulatory Visit: Payer: Self-pay | Admitting: Family Medicine

## 2020-09-03 DIAGNOSIS — D571 Sickle-cell disease without crisis: Secondary | ICD-10-CM

## 2020-09-03 DIAGNOSIS — G894 Chronic pain syndrome: Secondary | ICD-10-CM

## 2020-09-03 DIAGNOSIS — M79672 Pain in left foot: Secondary | ICD-10-CM

## 2020-09-03 MED ORDER — OXYCODONE-ACETAMINOPHEN 5-325 MG PO TABS: 1.0000 | ORAL_TABLET | Freq: Three times a day (TID) | ORAL | 0 refills | Status: DC | PRN

## 2020-09-03 NOTE — Telephone Encounter (Signed)
Pt refill request up for provider

## 2020-09-09 ENCOUNTER — Other Ambulatory Visit: Payer: Self-pay

## 2020-09-09 DIAGNOSIS — J302 Other seasonal allergic rhinitis: Secondary | ICD-10-CM

## 2020-09-09 MED ORDER — CETIRIZINE HCL 10 MG PO TABS: 10.0000 mg | ORAL_TABLET | Freq: Every day | ORAL | 3 refills | Status: DC

## 2020-09-13 DIAGNOSIS — E559 Vitamin D deficiency, unspecified: Secondary | ICD-10-CM

## 2020-09-13 HISTORY — DX: Vitamin D deficiency, unspecified: E55.9

## 2020-09-17 ENCOUNTER — Ambulatory Visit (INDEPENDENT_AMBULATORY_CARE_PROVIDER_SITE_OTHER): Payer: Medicaid Other | Admitting: Family Medicine

## 2020-09-17 ENCOUNTER — Encounter: Payer: Self-pay | Admitting: Family Medicine

## 2020-09-17 ENCOUNTER — Other Ambulatory Visit: Payer: Self-pay

## 2020-09-17 VITALS — BP 110/41 | HR 98 | Ht 73.0 in | Wt 173.0 lb

## 2020-09-17 DIAGNOSIS — J302 Other seasonal allergic rhinitis: Secondary | ICD-10-CM | POA: Diagnosis not present

## 2020-09-17 DIAGNOSIS — D571 Sickle-cell disease without crisis: Secondary | ICD-10-CM | POA: Diagnosis not present

## 2020-09-17 DIAGNOSIS — G894 Chronic pain syndrome: Secondary | ICD-10-CM

## 2020-09-17 DIAGNOSIS — Z09 Encounter for follow-up examination after completed treatment for conditions other than malignant neoplasm: Secondary | ICD-10-CM | POA: Diagnosis not present

## 2020-09-17 DIAGNOSIS — Z202 Contact with and (suspected) exposure to infections with a predominantly sexual mode of transmission: Secondary | ICD-10-CM

## 2020-09-17 MED ORDER — FEXOFENADINE-PSEUDOEPHED ER 180-240 MG PO TB24: 1.0000 | ORAL_TABLET | Freq: Every day | ORAL | 6 refills | Status: DC

## 2020-09-17 MED ORDER — OXYCODONE-ACETAMINOPHEN 10-325 MG PO TABS: 1.0000 | ORAL_TABLET | Freq: Three times a day (TID) | ORAL | 0 refills | Status: AC | PRN

## 2020-09-17 NOTE — Progress Notes (Signed)
[facility]   Established Patient Office Visit  Subjective:  Patient ID: Derrick Wu, male    DOB: 1996-05-19  Age: 25 y.o. MRN: [id number]  CC:  Chief Complaint  Patient presents with  . Sickle Cell Anemia    HPI Derrick Wu is a 25 year old male who presents for Follow Up of Sickle Cell Anemia today.   Patient Active Problem List   Diagnosis Date Noted  . Pain of left heel 12/04/2019  . Vitamin D deficiency 11/27/2019  . Dental filling status 01/06/2019  . Acute bilateral knee pain 11/13/2018  . Chronic pain syndrome 09/05/2018  . Seasonal allergies 09/05/2018  . Sickle cell anemia (HCC) 01/07/2017  . Rash and nonspecific skin eruption 01/07/2017  . 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 He states that he has chronic pain in his arms and legs. He rates his pain today at 5/10. He has not had a hospital visit for Sickle Cell Crisis for many years now. He is currently taking all medications as prescribed and staying well hydrated. He reports occasional nausea, constipation, dizziness and headaches. He denies fevers, chills, recent infections, weight loss, and night sweats. He has not had any visual changes, and falls. No chest pain, heart palpitations, cough and shortness of breath reported. Denies GI problems such as vomiting, and diarrhea. He has no reports of blood in stools, dysuria and hematuria. No depression or anxiety reported today. He is taking all medications as prescribed.   Past Medical History:  Diagnosis Date  . Asthma   . Headache(784.0)   . Pneumonia   . Seasonal allergies   . Sickle cell anemia (HCC)   . Vision abnormalities    Hx: of wears glasses    Past Surgical History:  Procedure Laterality Date  . TOOTH EXTRACTION N/A 05/26/2013   Procedure: EXTRACTION WISDOM TEETH - one, sixteen, seventeen  and thirtytwo.;  Surgeon: Georgia LopesScott M Jensen, DDS;  Location: [facility] OR;  Service: Oral Surgery;  Laterality: N/A;    Family History  Problem Relation Age of Onset  . Diabetes Mother   . Hypertension Mother   . Vision loss Mother   . Diabetes Father   . Hypertension Father   . Vision loss Father     Social History   Socioeconomic History  . Marital status: Single    Spouse name: Not on file  . Number of children: Not on file  . Years of education: Not on file  . Highest education level: Not on file  Occupational History  . Not on file  Tobacco Use  . Smoking status: Never Smoker  . Smokeless tobacco: Never Used  Vaping Use  . Vaping Use: Never used  Substance and Sexual Activity  . Alcohol use: No  . Drug use: No  . Sexual activity: Yes    Birth control/protection: Condom  Other Topics Concern  . Not on file  Social History Narrative   Pt is in 11th grade 2014-15   Social Determinants of Health   Financial Resource Strain: Not on file  Food Insecurity: Not on file  Transportation Needs: Not on file  Physical Activity: Not on file  Stress: Not on file  Social Connections: Not on file  Intimate Partner Violence: Not on file    Outpatient Medications Prior to Visit  Medication Sig Dispense Refill  . ibuprofen (  ADVIL) 800 MG tablet Take 1 tablet (800 mg total) by mouth every 8 (eight) hours as needed. 30 tablet 6  . tiZANidine (ZANAFLEX) 4 MG tablet Take 1 tablet (4 mg total) by mouth 3 (three) times daily. 90 tablet 6  . oxyCODONE-acetaminophen (PERCOCET) 5-325 MG tablet Take 1 tablet by mouth every 8 (eight) hours as needed for severe pain. 45 tablet 0  . folic acid (FOLVITE) 1 MG tablet Take 1 tablet (1 mg total) by mouth daily. (Patient not taking: Reported on 09/17/2020) 90 tablet 3  . cetirizine (ZYRTEC) 10 MG tablet Take 1 tablet (10 mg total) by mouth daily. (Patient not taking: Reported on 09/17/2020) 90 tablet 3  . doxycycline (VIBRAMYCIN) 100 MG capsule Take 100 mg  by mouth 2 (two) times daily.  (Patient not taking: Reported on 09/17/2020)     No facility-administered medications prior to visit.    Allergies  Allergen Reactions  . Mushroom Extract Complex Nausea And Vomiting    ROS Review of Systems  Constitutional: Negative.   HENT: Negative.   Eyes: Negative.   Respiratory: Positive for shortness of breath (occasional ).   Cardiovascular: Negative.   Gastrointestinal: Positive for constipation (occasional ) and nausea (occasional ).  Endocrine: Negative.   Genitourinary: Negative.   Musculoskeletal: Positive for arthralgias (generalized joint pain).  Skin: Negative.   Allergic/Immunologic: Negative.   Neurological: Positive for dizziness (occasional ) and headaches (occasional ).  Hematological: Negative.       Objective:    Physical Exam Vitals and nursing note reviewed.  Constitutional:      Appearance: Normal appearance.  HENT:     Head: Normocephalic and atraumatic.     Nose: Nose normal.     Mouth/Throat:     Mouth: Mucous membranes are moist.     Pharynx: Oropharynx is clear.  Cardiovascular:     Rate and Rhythm: Normal rate and regular rhythm.     Pulses: Normal pulses.     Heart sounds: Normal heart sounds.  Pulmonary:     Effort: Pulmonary effort is normal.     Breath sounds: Normal breath sounds.  Abdominal:     General: Bowel sounds are normal.     Palpations: Abdomen is soft.  Musculoskeletal:        General: Normal range of motion.     Cervical back: Normal range of motion and neck supple.  Skin:    General: Skin is warm and dry.  Neurological:     General: No focal deficit present.     Mental Status: He is alert and oriented to person, place, and time.  Psychiatric:        Mood and Affect: Mood normal.        Behavior: Behavior normal.        Thought Content: Thought content normal.        Judgment: Judgment normal.    BP (!) 110/41   Pulse 98   Ht 6\' 1"  (1.854 m)   Wt 173 lb (78.5 kg)   SpO2  100%   BMI 22.82 kg/m  Wt Readings from Last 3 Encounters:  09/17/20 173 lb (78.5 kg)  05/06/20 158 lb 9.6 oz (71.9 kg)  04/03/20 153 lb 6.4 oz (69.6 kg)    Health Maintenance Due  Topic Date Due  . COVID-19 Vaccine (1) Never done  . HPV VACCINES (1 - Male 2-dose series) Never done  . TETANUS/TDAP  Never done       Topic Date  Due  . HPV VACCINES (1 - Male 2-dose series) Never done    No results found for: TSH Lab Results  Component Value Date   WBC 8.6 02/02/2020   HGB 8.5 (L) 02/02/2020   HCT 27.6 (L) 02/02/2020   MCV 78 (L) 02/02/2020   PLT 568 (H) 02/02/2020   Lab Results  Component Value Date   NA 135 02/02/2020   K 4.6 02/02/2020   CO2 23 02/02/2020   GLUCOSE 106 (H) 02/02/2020   BUN 8 02/02/2020   CREATININE 0.70 (L) 02/02/2020   BILITOT 1.2 02/02/2020   ALKPHOS 97 02/02/2020   AST 48 (H) 02/02/2020   ALT 29 02/02/2020   PROT 8.2 02/02/2020   ALBUMIN 4.6 02/02/2020   CALCIUM 10.1 02/02/2020   No results found for: CHOL No results found for: HDL No results found for: LDLCALC No results found for: TRIG No results found for: CHOLHDL No results found for: SFKC1E  Assessment & Plan:   1. Hb-SS disease without crisis [facility]) He is doing well today r/t his chronic pain management. He will continue to take pain medications as prescribed; will continue to avoid extreme heat and cold; will continue to eat a healthy diet and drink at least 64 ounces of water daily; continue stool softener as needed; will avoid colds and flu; will continue to get plenty of sleep and rest; will continue to avoid high stressful situations and remain infection free; will continue Folic Acid 1 mg daily to avoid sickle cell crisis. Continue to follow up with Hematologist as needed.   2. Chronic pain syndrome  3. Seasonal allergies We will send Rx for Allegra-D today.   4. Possible exposure to STD  5. Follow up He will follow up in 3 months.   Meds ordered this encounter   Medications  . fexofenadine-pseudoephedrine (ALLEGRA-D 24) 180-240 MG 24 hr tablet    Sig: Take 1 tablet by mouth daily.    Dispense:  30 tablet    Refill:  6  . oxyCODONE-acetaminophen (PERCOCET) 10-325 MG tablet    Sig: Take 1 tablet by mouth every 8 (eight) hours as needed for up to 5 days for pain.    Dispense:  15 tablet    Refill:  0    **Dose increase**    Order Specific Question:   Supervising Provider    Answer:   Quentin Angst [id number]    No orders of the defined types were placed in this encounter.   Referral Orders  No referral(s) requested today    Raliegh Ip, MSN, ANE, FNP-BC [facility]/Internal Medicine/Sickle Cell Center [facility] [address] [place], [place] [address] [phone]- fax  Problem List Items Addressed This Visit      Other   Chronic pain syndrome   Relevant Medications   oxyCODONE-acetaminophen (PERCOCET) 10-325 MG tablet   Hb-SS disease without crisis (HCC) - Primary   Relevant Medications   oxyCODONE-acetaminophen (PERCOCET) 10-325 MG tablet   Seasonal allergies   Relevant Medications   fexofenadine-pseudoephedrine (ALLEGRA-D 24) 180-240 MG 24 hr tablet    Other Visit Diagnoses    Follow up          Meds ordered this encounter  Medications  . fexofenadine-pseudoephedrine (ALLEGRA-D 24) 180-240 MG 24 hr tablet    Sig: Take 1 tablet by mouth daily.    Dispense:  30 tablet    Refill:  6  . oxyCODONE-acetaminophen (PERCOCET) 10-325 MG tablet  Sig: Take 1 tablet by mouth every 8 (eight) hours as needed for up to 5 days for pain.    Dispense:  15 tablet    Refill:  0    **Dose increase**    Order Specific Question:   Supervising Provider    Answer:   Quentin Angst [[id number]]    Follow-up: No follow-ups on file.    Kallie Locks, FNP

## 2020-09-18 ENCOUNTER — Encounter: Payer: Self-pay | Admitting: Family Medicine

## 2020-09-18 ENCOUNTER — Other Ambulatory Visit: Payer: Self-pay | Admitting: Family Medicine

## 2020-09-18 DIAGNOSIS — E559 Vitamin D deficiency, unspecified: Secondary | ICD-10-CM

## 2020-09-18 LAB — CMP14+CBC/D/PLT+FER+RETIC+V...
ALT: 38 IU/L (ref 0–44)
AST: 49 IU/L — ABNORMAL HIGH (ref 0–40)
Albumin/Globulin Ratio: 1.2 (ref 1.2–2.2)
Albumin: 4.4 g/dL (ref 4.1–5.2)
Alkaline Phosphatase: 119 IU/L (ref 44–121)
BUN/Creatinine Ratio: 6 — ABNORMAL LOW (ref 9–20)
BUN: 5 mg/dL — ABNORMAL LOW (ref 6–20)
Basophils Absolute: 0.1 10*3/uL (ref 0.0–0.2)
Basos: 1 %
Bilirubin Total: 1 mg/dL (ref 0.0–1.2)
CO2: 21 mmol/L (ref 20–29)
Calcium: 9.5 mg/dL (ref 8.7–10.2)
Chloride: 100 mmol/L (ref 96–106)
Creatinine, Ser: 0.8 mg/dL (ref 0.76–1.27)
EOS (ABSOLUTE): 0.1 10*3/uL (ref 0.0–0.4)
Eos: 1 %
Ferritin: 37 ng/mL (ref 30–400)
Globulin, Total: 3.7 g/dL (ref 1.5–4.5)
Glucose: 86 mg/dL (ref 65–99)
Hematocrit: 29.5 % — ABNORMAL LOW (ref 37.5–51.0)
Hemoglobin: 8.8 g/dL — ABNORMAL LOW (ref 13.0–17.7)
Immature Grans (Abs): 0 10*3/uL (ref 0.0–0.1)
Immature Granulocytes: 0 %
Lymphocytes Absolute: 2.2 10*3/uL (ref 0.7–3.1)
Lymphs: 17 %
MCH: 22.9 pg — ABNORMAL LOW (ref 26.6–33.0)
MCHC: 29.8 g/dL — ABNORMAL LOW (ref 31.5–35.7)
MCV: 77 fL — ABNORMAL LOW (ref 79–97)
Monocytes Absolute: 1.4 10*3/uL — ABNORMAL HIGH (ref 0.1–0.9)
Monocytes: 11 %
NRBC: 1 % — ABNORMAL HIGH (ref 0–0)
Neutrophils Absolute: 9.6 10*3/uL — ABNORMAL HIGH (ref 1.4–7.0)
Neutrophils: 70 %
Platelets: 684 10*3/uL — ABNORMAL HIGH (ref 150–450)
Potassium: 4.5 mmol/L (ref 3.5–5.2)
RBC: 3.85 x10E6/uL — ABNORMAL LOW (ref 4.14–5.80)
RDW: 20.3 % — ABNORMAL HIGH (ref 11.6–15.4)
Retic Ct Pct: 5.8 % — ABNORMAL HIGH (ref 0.6–2.6)
Sodium: 139 mmol/L (ref 134–144)
Total Protein: 8.1 g/dL (ref 6.0–8.5)
Vit D, 25-Hydroxy: 12.5 ng/mL — ABNORMAL LOW (ref 30.0–100.0)
WBC: 13.5 10*3/uL — ABNORMAL HIGH (ref 3.4–10.8)
eGFR: 128 mL/min/{1.73_m2} (ref >59)

## 2020-09-18 MED ORDER — VITAMIN D (ERGOCALCIFEROL) 1.25 MG (50000 UNIT) PO CAPS: 50000.0000 [IU] | ORAL_CAPSULE | ORAL | 3 refills | Status: DC

## 2020-09-19 LAB — CHLAMYDIA/GONOCOCCUS/TRICHOMONAS, NAA
Chlamydia by NAA: NEGATIVE
Gonococcus by NAA: NEGATIVE
Trich vag by NAA: NEGATIVE

## 2020-09-24 ENCOUNTER — Encounter: Payer: Self-pay | Admitting: Family Medicine

## 2020-10-01 ENCOUNTER — Other Ambulatory Visit: Payer: Self-pay | Admitting: Family Medicine

## 2020-10-01 ENCOUNTER — Telehealth: Payer: Self-pay

## 2020-10-01 DIAGNOSIS — G894 Chronic pain syndrome: Secondary | ICD-10-CM

## 2020-10-01 DIAGNOSIS — D571 Sickle-cell disease without crisis: Secondary | ICD-10-CM

## 2020-10-01 MED ORDER — OXYCODONE-ACETAMINOPHEN 5-325 MG PO TABS: 1.0000 | ORAL_TABLET | ORAL | 0 refills | Status: DC | PRN

## 2020-10-01 NOTE — Progress Notes (Signed)
Reviewed PDMP substance reporting system prior to prescribing opiate medications. No inconsistencies noted.    Meds ordered this encounter  Medications  . oxyCODONE-acetaminophen (PERCOCET) 5-325 MG tablet    Sig: Take 1 tablet by mouth every 4 (four) hours as needed for severe pain.    Dispense:  45 tablet    Refill:  0    Order Specific Question:   Supervising Provider    Answer:   Quentin Angst [[id number]]    Nolon Nations  APRN, MSN, FNP-C Patient Care [facility] [address] [place], [place] [address] [phone]

## 2020-10-01 NOTE — Telephone Encounter (Signed)
Med refill Oxycodone 

## 2020-10-01 NOTE — Telephone Encounter (Signed)
Wron[facility] OXY refilled   Needs to be Oxycodone 10 mg NOT 5mg 

## 2020-10-08 ENCOUNTER — Telehealth: Payer: Self-pay

## 2020-10-08 NOTE — Telephone Encounter (Signed)
Med refill   Oxy 10 mg

## 2020-10-09 ENCOUNTER — Other Ambulatory Visit: Payer: Self-pay | Admitting: Family Medicine

## 2020-10-09 DIAGNOSIS — D571 Sickle-cell disease without crisis: Secondary | ICD-10-CM

## 2020-10-09 DIAGNOSIS — G894 Chronic pain syndrome: Secondary | ICD-10-CM

## 2020-10-09 MED ORDER — OXYCODONE-ACETAMINOPHEN 10-325 MG PO TABS: 1.0000 | ORAL_TABLET | Freq: Three times a day (TID) | ORAL | 0 refills | Status: DC | PRN

## 2020-10-09 NOTE — Progress Notes (Signed)
Reviewed PDMP substance reporting system prior to prescribing opiate medications. No inconsistencies noted.   Meds ordered this encounter  Medications  . oxyCODONE-acetaminophen (PERCOCET) 10-325 MG tablet    Sig: Take 1 tablet by mouth every 8 (eight) hours as needed for pain.    Dispense:  45 tablet    Refill:  0    Order Specific Question:   Supervising Provider    Answer:   Quentin Angst [[id number]]    Nolon Nations  APRN, MSN, FNP-C Patient Care [facility] [address] [place], [place] [address] [phone]

## 2020-10-25 ENCOUNTER — Other Ambulatory Visit: Payer: Self-pay

## 2020-10-25 DIAGNOSIS — G894 Chronic pain syndrome: Secondary | ICD-10-CM

## 2020-10-25 DIAGNOSIS — D571 Sickle-cell disease without crisis: Secondary | ICD-10-CM

## 2020-10-25 MED ORDER — OXYCODONE-ACETAMINOPHEN 10-325 MG PO TABS: 1.0000 | ORAL_TABLET | Freq: Three times a day (TID) | ORAL | 0 refills | Status: DC | PRN

## 2020-11-08 ENCOUNTER — Other Ambulatory Visit: Payer: Self-pay

## 2020-11-08 DIAGNOSIS — G894 Chronic pain syndrome: Secondary | ICD-10-CM

## 2020-11-08 DIAGNOSIS — D571 Sickle-cell disease without crisis: Secondary | ICD-10-CM

## 2020-11-11 MED ORDER — OXYCODONE-ACETAMINOPHEN 10-325 MG PO TABS: 1.0000 | ORAL_TABLET | Freq: Three times a day (TID) | ORAL | 0 refills | Status: DC | PRN

## 2020-11-27 ENCOUNTER — Other Ambulatory Visit: Payer: Self-pay

## 2020-11-27 DIAGNOSIS — D571 Sickle-cell disease without crisis: Secondary | ICD-10-CM

## 2020-11-27 DIAGNOSIS — G894 Chronic pain syndrome: Secondary | ICD-10-CM

## 2020-11-27 MED ORDER — OXYCODONE-ACETAMINOPHEN 10-325 MG PO TABS: 1.0000 | ORAL_TABLET | Freq: Three times a day (TID) | ORAL | 0 refills | Status: DC | PRN

## 2020-12-11 ENCOUNTER — Other Ambulatory Visit: Payer: Self-pay

## 2020-12-11 DIAGNOSIS — D571 Sickle-cell disease without crisis: Secondary | ICD-10-CM

## 2020-12-11 DIAGNOSIS — G894 Chronic pain syndrome: Secondary | ICD-10-CM

## 2020-12-12 ENCOUNTER — Other Ambulatory Visit: Payer: Self-pay | Admitting: Nurse Practitioner

## 2020-12-12 ENCOUNTER — Telehealth: Payer: Self-pay

## 2020-12-12 DIAGNOSIS — Z113 Encounter for screening for infections with a predominantly sexual mode of transmission: Secondary | ICD-10-CM

## 2020-12-12 NOTE — Telephone Encounter (Signed)
Patient coming in tomorrow 7/1 for labs.

## 2020-12-12 NOTE — Progress Notes (Signed)
   [facility] [address] [place], [place]  [address] Phone:  [phone]   Fax:  [phone] 

## 2020-12-12 NOTE — Telephone Encounter (Signed)
Pt is asking if he can come in asap for STD testing?

## 2020-12-13 ENCOUNTER — Other Ambulatory Visit: Payer: Medicaid Other

## 2020-12-13 ENCOUNTER — Other Ambulatory Visit: Payer: Self-pay

## 2020-12-13 DIAGNOSIS — Z113 Encounter for screening for infections with a predominantly sexual mode of transmission: Secondary | ICD-10-CM

## 2020-12-13 MED ORDER — OXYCODONE-ACETAMINOPHEN 10-325 MG PO TABS: 1.0000 | ORAL_TABLET | Freq: Three times a day (TID) | ORAL | 0 refills | Status: DC | PRN

## 2020-12-14 LAB — RPR: RPR Ser Ql: NONREACTIVE

## 2020-12-14 LAB — HIV ANTIBODY (ROUTINE TESTING W REFLEX): HIV Screen 4th Generation wRfx: NONREACTIVE

## 2020-12-18 ENCOUNTER — Ambulatory Visit: Payer: Self-pay | Admitting: Nurse Practitioner

## 2020-12-20 LAB — CHLAMYDIA/GONOCOCCUS/TRICHOMONAS, NAA
Chlamydia by NAA: NEGATIVE
Gonococcus by NAA: NEGATIVE
Trich vag by NAA: NEGATIVE

## 2020-12-30 ENCOUNTER — Other Ambulatory Visit: Payer: Self-pay | Admitting: Nurse Practitioner

## 2020-12-30 ENCOUNTER — Telehealth: Payer: Self-pay | Admitting: Nurse Practitioner

## 2020-12-30 DIAGNOSIS — E559 Vitamin D deficiency, unspecified: Secondary | ICD-10-CM

## 2020-12-30 DIAGNOSIS — G894 Chronic pain syndrome: Secondary | ICD-10-CM

## 2020-12-30 DIAGNOSIS — D571 Sickle-cell disease without crisis: Secondary | ICD-10-CM

## 2020-12-30 MED ORDER — FOLIC ACID 1 MG PO TABS: 1.0000 mg | ORAL_TABLET | Freq: Every day | ORAL | 3 refills | Status: DC

## 2020-12-30 MED ORDER — OXYCODONE-ACETAMINOPHEN 10-325 MG PO TABS: 1.0000 | ORAL_TABLET | Freq: Three times a day (TID) | ORAL | 0 refills | Status: DC | PRN

## 2020-12-30 MED ORDER — VITAMIN D (ERGOCALCIFEROL) 1.25 MG (50000 UNIT) PO CAPS: 50000.0000 [IU] | ORAL_CAPSULE | ORAL | 3 refills | Status: AC

## 2020-12-30 NOTE — Telephone Encounter (Signed)
Patient requesting folic acid and Oxycodone 10mg   Vitamin D - asking if he should continue taking or stop?

## 2021-01-13 ENCOUNTER — Other Ambulatory Visit: Payer: Self-pay

## 2021-01-13 DIAGNOSIS — G894 Chronic pain syndrome: Secondary | ICD-10-CM

## 2021-01-13 DIAGNOSIS — D571 Sickle-cell disease without crisis: Secondary | ICD-10-CM

## 2021-01-15 ENCOUNTER — Other Ambulatory Visit: Payer: Self-pay

## 2021-01-15 DIAGNOSIS — D571 Sickle-cell disease without crisis: Secondary | ICD-10-CM

## 2021-01-15 DIAGNOSIS — G894 Chronic pain syndrome: Secondary | ICD-10-CM

## 2021-01-15 MED ORDER — OXYCODONE-ACETAMINOPHEN 10-325 MG PO TABS: 1.0000 | ORAL_TABLET | Freq: Three times a day (TID) | ORAL | 0 refills | Status: DC | PRN

## 2021-01-29 ENCOUNTER — Telehealth: Payer: Self-pay

## 2021-01-29 ENCOUNTER — Other Ambulatory Visit: Payer: Self-pay | Admitting: Nurse Practitioner

## 2021-01-29 DIAGNOSIS — G894 Chronic pain syndrome: Secondary | ICD-10-CM

## 2021-01-29 DIAGNOSIS — D571 Sickle-cell disease without crisis: Secondary | ICD-10-CM

## 2021-01-29 MED ORDER — OXYCODONE-ACETAMINOPHEN 10-325 MG PO TABS: 1.0000 | ORAL_TABLET | Freq: Three times a day (TID) | ORAL | 0 refills | Status: DC | PRN

## 2021-01-29 NOTE — Telephone Encounter (Signed)
Folic acid Oxycodone

## 2021-02-13 ENCOUNTER — Ambulatory Visit: Payer: Self-pay | Admitting: Nurse Practitioner

## 2021-02-13 ENCOUNTER — Other Ambulatory Visit: Payer: Self-pay

## 2021-02-13 DIAGNOSIS — D571 Sickle-cell disease without crisis: Secondary | ICD-10-CM

## 2021-02-13 DIAGNOSIS — G894 Chronic pain syndrome: Secondary | ICD-10-CM

## 2021-02-14 ENCOUNTER — Telehealth: Payer: Self-pay

## 2021-02-14 NOTE — Telephone Encounter (Signed)
Oxycodone  °

## 2021-02-18 ENCOUNTER — Other Ambulatory Visit: Payer: Self-pay | Admitting: Family Medicine

## 2021-02-18 ENCOUNTER — Telehealth: Payer: Self-pay

## 2021-02-18 DIAGNOSIS — G894 Chronic pain syndrome: Secondary | ICD-10-CM

## 2021-02-18 DIAGNOSIS — D571 Sickle-cell disease without crisis: Secondary | ICD-10-CM

## 2021-02-18 MED ORDER — OXYCODONE-ACETAMINOPHEN 10-325 MG PO TABS: 1.0000 | ORAL_TABLET | Freq: Three times a day (TID) | ORAL | 0 refills | Status: DC | PRN

## 2021-02-18 NOTE — Progress Notes (Signed)
Reviewed PDMP substance reporting system prior to prescribing opiate medications. No inconsistencies noted.  Meds ordered this encounter  Medications   oxyCODONE-acetaminophen (PERCOCET) 10-325 MG tablet    Sig: Take 1 tablet by mouth every 8 (eight) hours as needed for pain.    Dispense:  45 tablet    Refill:  0    The refill date is 01/30/2021 . Thank you.    Order Specific Question:   Supervising Provider    Answer:   Quentin Angst [[id number]]    Nolon Nations  APRN, MSN, FNP-C Patient Care [facility] [address] [place], [place] [address] [phone]

## 2021-02-18 NOTE — Telephone Encounter (Signed)
Oxycodone  °

## 2021-02-20 ENCOUNTER — Other Ambulatory Visit: Payer: Self-pay | Admitting: Family Medicine

## 2021-02-20 ENCOUNTER — Other Ambulatory Visit: Payer: Self-pay

## 2021-02-20 DIAGNOSIS — G894 Chronic pain syndrome: Secondary | ICD-10-CM

## 2021-02-20 DIAGNOSIS — D571 Sickle-cell disease without crisis: Secondary | ICD-10-CM

## 2021-02-24 ENCOUNTER — Ambulatory Visit (INDEPENDENT_AMBULATORY_CARE_PROVIDER_SITE_OTHER): Payer: Medicaid Other | Admitting: Nurse Practitioner

## 2021-02-24 ENCOUNTER — Other Ambulatory Visit: Payer: Self-pay

## 2021-02-24 ENCOUNTER — Encounter: Payer: Self-pay | Admitting: Nurse Practitioner

## 2021-02-24 VITALS — BP 129/57 | HR 79 | Temp 99.1°F | Ht 73.0 in | Wt 171.6 lb

## 2021-02-24 DIAGNOSIS — D571 Sickle-cell disease without crisis: Secondary | ICD-10-CM | POA: Diagnosis not present

## 2021-02-24 DIAGNOSIS — M79671 Pain in right foot: Secondary | ICD-10-CM | POA: Diagnosis not present

## 2021-02-24 DIAGNOSIS — M79672 Pain in left foot: Secondary | ICD-10-CM

## 2021-02-24 DIAGNOSIS — Z131 Encounter for screening for diabetes mellitus: Secondary | ICD-10-CM | POA: Diagnosis not present

## 2021-02-24 LAB — POCT URINALYSIS DIP (CLINITEK)
Bilirubin, UA: NEGATIVE
Blood, UA: NEGATIVE
Glucose, UA: NEGATIVE mg/dL
Ketones, POC UA: NEGATIVE mg/dL
Leukocytes, UA: NEGATIVE
Nitrite, UA: NEGATIVE
POC PROTEIN,UA: NEGATIVE
Spec Grav, UA: 1.02 (ref 1.010–1.025)
Urobilinogen, UA: 0.2 E.U./dL
pH, UA: 7 (ref 5.0–8.0)

## 2021-02-24 NOTE — Progress Notes (Signed)
[name], [place]  [address] Phone:  [phone]   Fax:  [phone]   Established Patient Office Visit  Subjective:  Patient ID: Derrick Wu, male    DOB: June 22, 1995  Age: 25 y.o. MRN: [id number]  CC:  Chief Complaint  Patient presents with   Follow-up    Pt is here for his follow up visit. Pt states that he is needing a prior auth for oxycodone for medicaid. Pt has no concerns or issues to discuss today.    HPI [name] presents for follow up. He  has a past medical history of Asthma, Headache(784.0), Pneumonia, Seasonal allergies, Sickle cell anemia ([place]), Vision abnormalities, and Vitamin D deficiency (09/2020).   He has pain in his feet. He reports that this is his place of pain. He stands on his feet all day at work and this difficult on his feet. He feels like there is some swelling in his right ankle. He has been treated by podiatry in the past with steroid injections which was effective. He is considering follow up to get orthotics to help improve his feet. Denies fever, headache, cough, wheezing, shortness of breath, chest pains, abdominal pain, back pain, hip pain, or leg pain. Denies any open wounds, skin irritation. He is exercising to improve his weight. He is up 10 pounds.  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   Past Medical History:  Diagnosis Date   Asthma    Headache(784.0)    Pneumonia    Seasonal allergies    Sickle cell anemia ([place])    Vision abnormalities    Hx: of wears glasses   Vitamin D deficiency 09/2020    Past Surgical History:  Procedure Laterality Date   TOOTH EXTRACTION N/A 05/26/2013   Procedure: EXTRACTION WISDOM TEETH - one, sixteen, seventeen and thirtytwo.;  Surgeon: Gae Bon, DDS;  Location: [facility];  Service: Oral Surgery;  Laterality: N/A;    Family History  Problem  Relation Age of Onset   Diabetes Mother    Hypertension Mother    Vision loss Mother    Diabetes Father    Hypertension Father    Vision loss Father     Social History   Socioeconomic History   Marital status: Single    Spouse name: Not on file   Number of children: Not on file   Years of education: Not on file   Highest education level: Not on file  Occupational History   Not on file  Tobacco Use   Smoking status: Never   Smokeless tobacco: Never  Vaping Use   Vaping Use: Never used  Substance and Sexual Activity   Alcohol use: No   Drug use: No   Sexual activity: Yes    Birth control/protection: Condom  Other Topics Concern   Not on file  Social History Narrative   Pt is in 11th grade 2014-15   Social Determinants of Health   Financial Resource Strain: Not on file  Food Insecurity: Not on file  Transportation Needs: Not on file  Physical Activity: Not on file  Stress: Not on file  Social Connections: Not on file  Intimate Partner Violence: Not on file    Outpatient Medications Prior to Visit  Medication Sig Dispense Refill   folic acid (FOLVITE) 1 MG tablet Take 1 tablet (1 mg total) by mouth daily.  90 tablet 3   oxyCODONE-acetaminophen (PERCOCET) 10-325 MG tablet Take 1 tablet by mouth every 8 (eight) hours as needed for pain. 45 tablet 0   fexofenadine-pseudoephedrine (ALLEGRA-D 24) 180-240 MG 24 hr tablet Take 1 tablet by mouth daily. (Patient not taking: Reported on 02/24/2021) 30 tablet 6   ibuprofen (ADVIL) 800 MG tablet Take 1 tablet (800 mg total) by mouth every 8 (eight) hours as needed. (Patient not taking: Reported on 02/24/2021) 30 tablet 6   tiZANidine (ZANAFLEX) 4 MG tablet Take 1 tablet (4 mg total) by mouth 3 (three) times daily. (Patient not taking: Reported on 02/24/2021) 90 tablet 6   Vitamin D, Ergocalciferol, (DRISDOL) 1.25 MG (50000 UNIT) CAPS capsule Take 1 capsule (50,000 Units total) by mouth every 7 (seven) days. (Patient not taking:  Reported on 02/24/2021) 12 capsule 3   No facility-administered medications prior to visit.    Allergies  Allergen Reactions   Mushroom Extract Complex Nausea And Vomiting    ROS Review of Systems    Objective:    Physical Exam HENT:     Head: Normocephalic and atraumatic.     Nose: Nose normal.     Mouth/Throat:     Mouth: Mucous membranes are moist.  Cardiovascular:     Rate and Rhythm: Normal rate and regular rhythm.     Pulses: Normal pulses.     Heart sounds: Normal heart sounds.  Pulmonary:     Effort: Pulmonary effort is normal.     Breath sounds: Normal breath sounds.  Abdominal:     General: Bowel sounds are normal.     Palpations: Abdomen is soft.  Musculoskeletal:        General: Normal range of motion.     Cervical back: Normal range of motion.  Skin:    General: Skin is warm and dry.     Capillary Refill: Capillary refill takes less than 2 seconds.  Neurological:     General: No focal deficit present.     Mental Status: He is alert and oriented to person, place, and time.  Psychiatric:        Mood and Affect: Mood normal.        Behavior: Behavior normal.        Thought Content: Thought content normal.        Judgment: Judgment normal.    BP (!) 129/57   Pulse 79   Temp 99.1 F (37.3 C)   Ht $R'6\' 1"'Bd$  (1.854 m)   Wt 171 lb 9.6 oz (77.8 kg)   SpO2 97%   BMI 22.64 kg/m  Wt Readings from Last 3 Encounters:  02/24/21 171 lb 9.6 oz (77.8 kg)  09/17/20 173 lb (78.5 kg)  05/06/20 158 lb 9.6 oz (71.9 kg)     Health Maintenance Due  Topic Date Due   HPV VACCINES (1 - Male 2-dose series) Never done   TETANUS/TDAP  Never done   Pneumococcal Vaccine 70-67 Years old (3 - PCV) 01/07/2017   INFLUENZA VACCINE  Never done       Topic Date Due   HPV VACCINES (1 - Male 2-dose series) Never done    No results found for: TSH Lab Results  Component Value Date   WBC 13.5 (H) 09/17/2020   HGB 8.8 (L) 09/17/2020   HCT 29.5 (L) 09/17/2020   MCV 77 (L)  09/17/2020   PLT 684 (H) 09/17/2020   Lab Results  Component Value Date   NA 139 09/17/2020   K  4.5 09/17/2020   CO2 21 09/17/2020   GLUCOSE 86 09/17/2020   BUN 5 (L) 09/17/2020   CREATININE 0.80 09/17/2020   BILITOT 1.0 09/17/2020   ALKPHOS 119 09/17/2020   AST 49 (H) 09/17/2020   ALT 38 09/17/2020   PROT 8.1 09/17/2020   ALBUMIN 4.4 09/17/2020   CALCIUM 9.5 09/17/2020   EGFR 128 09/17/2020   No results found for: CHOL No results found for: HDL No results found for: LDLCALC No results found for: TRIG No results found for: CHOLHDL No results found for: HGBA1C    Assessment & Plan:   Problem List Items Addressed This Visit       Other   Hb-SS disease without crisis ([place]) - Primary Ensure adequate hydration. Move frequently to reduce venous thromboembolism risk. Avoid situations that could lead to dehydration or could exacerbate pain Discussed S&S of infection, seizures, stroke acute chest, DVT and how important it is to seek medical attention Take medication as directed along with pain contract and overall compliance Discussed the risk related to opiate use (addition, tolerance and dependency)    Relevant Orders   POCT URINALYSIS DIP (CLINITEK) (Completed)   Sickle Cell Panel   Other Visit Diagnoses     Screening for diabetes mellitus (DM)       Relevant Orders   Fructosamine   Foot pain, bilateral     Encouraged pt to call and make a follow up at for orthotics as dicussed        No orders of the defined types were placed in this encounter.   Follow-up: No follow-ups on file.    Vevelyn Francois, NP

## 2021-02-24 NOTE — Patient Instructions (Signed)
Sickle Cell Anemia, Adult ?Sickle cell anemia is a condition where your red blood cells are shaped like sickles. Red blood cells carry oxygen through the body. Sickle-shaped cells do not live as long as normal red blood cells. They also clump together and block blood from flowing through the blood vessels. This prevents the body from getting enough oxygen. Sickle cell anemia causes organ damage and pain. It also increases the risk of infection. ?Follow these instructions at home: ?Medicines ?Take over-the-counter and prescription medicines only as told by your doctor. ?If you were prescribed an antibiotic medicine, take it as told by your doctor. Do not stop taking the antibiotic even if you start to feel better. ?If you develop a fever, do not take medicines to lower the fever right away. Tell your doctor about the fever. ?Managing pain, stiffness, and swelling ?Try these methods to help with pain: ?Use a heating pad. ?Take a warm bath. ?Distract yourself, such as by watching TV. ?Eating and drinking ?Drink enough fluid to keep your pee (urine) clear or pale yellow. Drink more in hot weather and during exercise. ?Limit or avoid alcohol. ?Eat a healthy diet. Eat plenty of fruits, vegetables, whole grains, and lean protein. ?Take vitamins and supplements as told by your doctor. ?Traveling ?When traveling, keep these with you: ?Your medical information. ?The names of your doctors. ?Your medicines. ?If you need to take an airplane, talk to your doctor first. ?Activity ?Rest often. ?Avoid exercises that make your heart beat much faster, such as jogging. ?General instructions ?Do not use products that have nicotine or tobacco, such as cigarettes and e-cigarettes. If you need help quitting, ask your doctor. ?Consider wearing a medical alert bracelet. ?Avoid being in high places (high altitudes), such as mountains. ?Avoid very hot or cold temperatures. ?Avoid places where the temperature changes a lot. ?Keep all follow-up  visits as told by your doctor. This is important. ?Contact a doctor if: ?A joint hurts. ?Your feet or hands hurt or swell. ?You feel tired (fatigued). ?Get help right away if: ?You have symptoms of infection. These include: ?Fever. ?Chills. ?Being very tired. ?Irritability. ?Poor eating. ?Throwing up (vomiting). ?You feel dizzy or faint. ?You have new stomach pain, especially on the left side. ?You have a an erection (priapism) that lasts more than 4 hours. ?You have numbness in your arms or legs. ?You have a hard time moving your arms or legs. ?You have trouble talking. ?You have pain that does not go away when you take medicine. ?You are short of breath. ?You are breathing fast. ?You have a long-term cough. ?You have pain in your chest. ?You have a bad headache. ?You have a stiff neck. ?Your stomach looks bloated even though you did not eat much. ?Your skin is pale. ?You suddenly cannot see well. ?Summary ?Sickle cell anemia is a condition where your red blood cells are shaped like sickles. ?Follow your doctor's advice on ways to manage pain, food to eat, activities to do, and steps to take for safe travel. ?Get medical help right away if you have any signs of infection, such as a fever. ?This information is not intended to replace advice given to you by your health care provider. Make sure you discuss any questions you have with your health care provider. ?Document Revised: 10/26/2019 Document Reviewed: 10/26/2019 ?Elsevier Patient Education ? 2022 Elsevier Inc. ? ?

## 2021-02-25 ENCOUNTER — Telehealth: Payer: Self-pay

## 2021-02-25 NOTE — Telephone Encounter (Signed)
Prior approval for oxycodone-acetaminophen approved via [employer].  #[id number] P

## 2021-02-26 LAB — CMP14+CBC/D/PLT+FER+RETIC+V...
ALT: 30 IU/L (ref 0–44)
AST: 33 IU/L (ref 0–40)
Albumin/Globulin Ratio: 1.2 (ref 1.2–2.2)
Albumin: 4.3 g/dL (ref 4.1–5.2)
Alkaline Phosphatase: 119 IU/L (ref 44–121)
BUN/Creatinine Ratio: 8 — ABNORMAL LOW (ref 9–20)
BUN: 6 mg/dL (ref 6–20)
Basophils Absolute: 0.2 10*3/uL (ref 0.0–0.2)
Basos: 2 %
Bilirubin Total: 0.6 mg/dL (ref 0.0–1.2)
CO2: 21 mmol/L (ref 20–29)
Calcium: 9.5 mg/dL (ref 8.7–10.2)
Chloride: 101 mmol/L (ref 96–106)
Creatinine, Ser: 0.8 mg/dL (ref 0.76–1.27)
EOS (ABSOLUTE): 0.4 10*3/uL (ref 0.0–0.4)
Eos: 5 %
Ferritin: 22 ng/mL — ABNORMAL LOW (ref 30–400)
Globulin, Total: 3.5 g/dL (ref 1.5–4.5)
Glucose: 105 mg/dL — ABNORMAL HIGH (ref 65–99)
Hematocrit: 28.4 % — ABNORMAL LOW (ref 37.5–51.0)
Hemoglobin: 8.3 g/dL — ABNORMAL LOW (ref 13.0–17.7)
Immature Grans (Abs): 0 10*3/uL (ref 0.0–0.1)
Immature Granulocytes: 0 %
Lymphocytes Absolute: 3.4 10*3/uL — ABNORMAL HIGH (ref 0.7–3.1)
Lymphs: 40 %
MCH: 21.9 pg — ABNORMAL LOW (ref 26.6–33.0)
MCHC: 29.2 g/dL — ABNORMAL LOW (ref 31.5–35.7)
MCV: 75 fL — ABNORMAL LOW (ref 79–97)
Monocytes Absolute: 0.8 10*3/uL (ref 0.1–0.9)
Monocytes: 10 %
NRBC: 1 % — ABNORMAL HIGH (ref 0–0)
Neutrophils Absolute: 3.6 10*3/uL (ref 1.4–7.0)
Neutrophils: 43 %
Platelets: 622 10*3/uL — ABNORMAL HIGH (ref 150–450)
Potassium: 4 mmol/L (ref 3.5–5.2)
RBC: 3.79 x10E6/uL — ABNORMAL LOW (ref 4.14–5.80)
RDW: 19.9 % — ABNORMAL HIGH (ref 11.6–15.4)
Retic Ct Pct: 5.2 % — ABNORMAL HIGH (ref 0.6–2.6)
Sodium: 137 mmol/L (ref 134–144)
Total Protein: 7.8 g/dL (ref 6.0–8.5)
Vit D, 25-Hydroxy: 15.9 ng/mL — ABNORMAL LOW (ref 30.0–100.0)
WBC: 8.5 10*3/uL (ref 3.4–10.8)
eGFR: 127 mL/min/{1.73_m2} (ref >59)

## 2021-02-26 LAB — FRUCTOSAMINE: Fructosamine: 238 umol/L (ref 0–285)

## 2021-03-13 ENCOUNTER — Other Ambulatory Visit: Payer: Self-pay

## 2021-03-13 DIAGNOSIS — G894 Chronic pain syndrome: Secondary | ICD-10-CM

## 2021-03-13 DIAGNOSIS — D571 Sickle-cell disease without crisis: Secondary | ICD-10-CM

## 2021-03-14 ENCOUNTER — Telehealth: Payer: Self-pay

## 2021-03-14 MED ORDER — OXYCODONE-ACETAMINOPHEN 10-325 MG PO TABS: 1.0000 | ORAL_TABLET | Freq: Three times a day (TID) | ORAL | 0 refills | Status: DC | PRN

## 2021-03-14 NOTE — Telephone Encounter (Signed)
Oxycodone  °

## 2021-03-25 NOTE — Telephone Encounter (Signed)
Pt is aware.  

## 2021-03-27 ENCOUNTER — Other Ambulatory Visit: Payer: Self-pay

## 2021-03-27 DIAGNOSIS — D571 Sickle-cell disease without crisis: Secondary | ICD-10-CM

## 2021-03-27 DIAGNOSIS — G894 Chronic pain syndrome: Secondary | ICD-10-CM

## 2021-03-28 MED ORDER — OXYCODONE-ACETAMINOPHEN 10-325 MG PO TABS: 1.0000 | ORAL_TABLET | Freq: Three times a day (TID) | ORAL | 0 refills | Status: DC | PRN

## 2021-04-10 ENCOUNTER — Other Ambulatory Visit: Payer: Self-pay

## 2021-04-10 DIAGNOSIS — D571 Sickle-cell disease without crisis: Secondary | ICD-10-CM

## 2021-04-10 DIAGNOSIS — G894 Chronic pain syndrome: Secondary | ICD-10-CM

## 2021-04-11 ENCOUNTER — Other Ambulatory Visit: Payer: Self-pay | Admitting: Nurse Practitioner

## 2021-04-11 DIAGNOSIS — D571 Sickle-cell disease without crisis: Secondary | ICD-10-CM

## 2021-04-11 DIAGNOSIS — G894 Chronic pain syndrome: Secondary | ICD-10-CM

## 2021-04-11 MED ORDER — OXYCODONE-ACETAMINOPHEN 10-325 MG PO TABS: 1.0000 | ORAL_TABLET | Freq: Three times a day (TID) | ORAL | 0 refills | Status: DC | PRN

## 2021-04-28 ENCOUNTER — Telehealth: Payer: Self-pay

## 2021-04-28 ENCOUNTER — Other Ambulatory Visit: Payer: Self-pay

## 2021-04-28 DIAGNOSIS — D571 Sickle-cell disease without crisis: Secondary | ICD-10-CM

## 2021-04-28 DIAGNOSIS — G894 Chronic pain syndrome: Secondary | ICD-10-CM

## 2021-04-28 MED ORDER — OXYCODONE-ACETAMINOPHEN 10-325 MG PO TABS: 1.0000 | ORAL_TABLET | Freq: Three times a day (TID) | ORAL | 0 refills | Status: DC | PRN

## 2021-04-28 NOTE — Telephone Encounter (Signed)
Oxycodone to be refilled

## 2021-05-12 ENCOUNTER — Other Ambulatory Visit: Payer: Self-pay | Admitting: Nurse Practitioner

## 2021-05-12 DIAGNOSIS — G894 Chronic pain syndrome: Secondary | ICD-10-CM

## 2021-05-12 DIAGNOSIS — D571 Sickle-cell disease without crisis: Secondary | ICD-10-CM

## 2021-05-12 MED ORDER — OXYCODONE-ACETAMINOPHEN 10-325 MG PO TABS: 1.0000 | ORAL_TABLET | Freq: Three times a day (TID) | ORAL | 0 refills | Status: DC | PRN

## 2021-05-29 ENCOUNTER — Other Ambulatory Visit: Payer: Self-pay | Admitting: Nurse Practitioner

## 2021-05-29 DIAGNOSIS — G894 Chronic pain syndrome: Secondary | ICD-10-CM

## 2021-05-29 DIAGNOSIS — D571 Sickle-cell disease without crisis: Secondary | ICD-10-CM

## 2021-05-29 MED ORDER — OXYCODONE-ACETAMINOPHEN 10-325 MG PO TABS: 1.0000 | ORAL_TABLET | Freq: Three times a day (TID) | ORAL | 0 refills | Status: DC | PRN

## 2021-06-17 ENCOUNTER — Telehealth: Payer: Self-pay

## 2021-06-17 NOTE — Telephone Encounter (Signed)
Oxycodone  °

## 2021-06-20 ENCOUNTER — Other Ambulatory Visit: Payer: Self-pay | Admitting: Nurse Practitioner

## 2021-06-20 DIAGNOSIS — D571 Sickle-cell disease without crisis: Secondary | ICD-10-CM

## 2021-06-20 DIAGNOSIS — G894 Chronic pain syndrome: Secondary | ICD-10-CM

## 2021-06-20 MED ORDER — OXYCODONE-ACETAMINOPHEN 10-325 MG PO TABS: 1.0000 | ORAL_TABLET | Freq: Three times a day (TID) | ORAL | 0 refills | Status: DC | PRN

## 2021-07-04 ENCOUNTER — Other Ambulatory Visit: Payer: Self-pay | Admitting: Nurse Practitioner

## 2021-07-04 DIAGNOSIS — G894 Chronic pain syndrome: Secondary | ICD-10-CM

## 2021-07-04 DIAGNOSIS — D571 Sickle-cell disease without crisis: Secondary | ICD-10-CM

## 2021-07-04 MED ORDER — OXYCODONE-ACETAMINOPHEN 10-325 MG PO TABS: 1.0000 | ORAL_TABLET | Freq: Three times a day (TID) | ORAL | 0 refills | Status: DC | PRN

## 2021-07-24 ENCOUNTER — Encounter: Payer: Self-pay | Admitting: Nurse Practitioner

## 2021-07-24 ENCOUNTER — Ambulatory Visit (INDEPENDENT_AMBULATORY_CARE_PROVIDER_SITE_OTHER): Payer: Medicaid Other | Admitting: Nurse Practitioner

## 2021-07-24 ENCOUNTER — Other Ambulatory Visit: Payer: Self-pay

## 2021-07-24 VITALS — BP 133/50 | HR 85 | Temp 99.1°F | Ht 73.0 in | Wt 166.5 lb

## 2021-07-24 DIAGNOSIS — D571 Sickle-cell disease without crisis: Secondary | ICD-10-CM

## 2021-07-24 DIAGNOSIS — J302 Other seasonal allergic rhinitis: Secondary | ICD-10-CM | POA: Diagnosis not present

## 2021-07-24 DIAGNOSIS — G894 Chronic pain syndrome: Secondary | ICD-10-CM | POA: Diagnosis not present

## 2021-07-24 DIAGNOSIS — Z113 Encounter for screening for infections with a predominantly sexual mode of transmission: Secondary | ICD-10-CM

## 2021-07-24 MED ORDER — FEXOFENADINE-PSEUDOEPHED ER 180-240 MG PO TB24: 1.0000 | ORAL_TABLET | Freq: Every day | ORAL | 6 refills | Status: DC

## 2021-07-24 MED ORDER — ONE DAILY MULTIVIT-MIN ADULT PO TABS: 1.0000 | ORAL_TABLET | Freq: Every day | ORAL | 1 refills | Status: DC

## 2021-07-24 NOTE — Progress Notes (Signed)
[place] [address], [place]  [address] Phone:  [phone]   Fax:  [phone]   Established Patient Office Visit  Subjective:  Patient ID: Derrick Wu, male    DOB: 09-19-1995  Age: 26 y.o. MRN: [id number]  CC:  Chief Complaint  Patient presents with   Follow-up    Pt is here for 3 month follow up visit. Pt would like a STD testing.    HPI Derrick Wu presents for follow up . He  has a past medical history of Asthma, Headache(784.0), Pneumonia, Seasonal allergies, Sickle cell anemia ([place]), Vision abnormalities, and Vitamin D deficiency (09/2020).   He is in today for follow-up for sickle cell disease.  He reports that he is doing well overall.  He is interested in his current hemoglobin.  He does continue to have chronic pain however this is treated with oxycodone at home.  He does prefer to use the oxycodone versus ibuprofen.  His major pain site is his feet.  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  He has been pacing himself.  He does continue to work full-time. Denies fever, headache, cough, wheezing, shortness of breath, chest pains, abdominal pain, back pain, hip pain, or leg pain. Denies any open wounds, skin irritation.  Eye exam to be scheduled  He is requesting STD screening.Denies oral lesions,, sore throat, pelvic pain, penile discharge, dysuria, nocturia any visible ulcers or lesions  Past Medical History:  Diagnosis Date   Asthma    Headache(784.0)    Pneumonia    Seasonal allergies    Sickle cell anemia ([place])    Vision abnormalities    Hx: of wears glasses   Vitamin D deficiency 09/2020    Past Surgical History:  Procedure Laterality Date   TOOTH EXTRACTION N/A 05/26/2013   Procedure: EXTRACTION WISDOM TEETH - one, sixteen, seventeen and thirtytwo.;  Surgeon: Gae Bon, DDS;  Location: [facility];  Service: Oral Surgery;  Laterality: N/A;    Family History  Problem  Relation Age of Onset   Diabetes Mother    Hypertension Mother    Vision loss Mother    Diabetes Father    Hypertension Father    Vision loss Father     Social History   Socioeconomic History   Marital status: Single    Spouse name: Not on file   Number of children: Not on file   Years of education: Not on file   Highest education level: Not on file  Occupational History   Not on file  Tobacco Use   Smoking status: Never   Smokeless tobacco: Never  Vaping Use   Vaping Use: Never used  Substance and Sexual Activity   Alcohol use: No   Drug use: No   Sexual activity: Yes    Birth control/protection: Condom  Other Topics Concern   Not on file  Social History Narrative   Pt is in 11th grade 2014-15   Social Determinants of Health   Financial Resource Strain: Not on file  Food Insecurity: Not on file  Transportation Needs: Not on file  Physical Activity: Not on file  Stress: Not on file  Social Connections: Not on file  Intimate Partner Violence: Not on file    Outpatient Medications Prior to Visit  Medication Sig Dispense Refill   folic acid (FOLVITE) 1 MG tablet Take 1 tablet (1 mg total) by mouth daily. 90 tablet 3  ibuprofen (ADVIL) 800 MG tablet Take 1 tablet (800 mg total) by mouth every 8 (eight) hours as needed. 30 tablet 6   oxyCODONE-acetaminophen (PERCOCET) 10-325 MG tablet Take 1 tablet by mouth every 8 (eight) hours as needed for pain. 45 tablet 0   Vitamin D, Ergocalciferol, (DRISDOL) 1.25 MG (50000 UNIT) CAPS capsule Take 1 capsule (50,000 Units total) by mouth every 7 (seven) days. 12 capsule 3   fexofenadine-pseudoephedrine (ALLEGRA-D 24) 180-240 MG 24 hr tablet Take 1 tablet by mouth daily. (Patient not taking: Reported on 02/24/2021) 30 tablet 6   tiZANidine (ZANAFLEX) 4 MG tablet Take 1 tablet (4 mg total) by mouth 3 (three) times daily. (Patient not taking: Reported on 02/24/2021) 90 tablet 6   No facility-administered medications prior to visit.     Allergies  Allergen Reactions   Mushroom Extract Complex Nausea And Vomiting    ROS Review of Systems    Objective:    Physical Exam Constitutional:      General: He is not in acute distress.    Appearance: He is normal weight.  HENT:     Head: Normocephalic and atraumatic.  Cardiovascular:     Rate and Rhythm: Normal rate and regular rhythm.     Pulses: Normal pulses.     Heart sounds: Normal heart sounds.  Pulmonary:     Effort: Pulmonary effort is normal.     Breath sounds: Normal breath sounds.  Abdominal:     Palpations: Abdomen is soft.  Musculoskeletal:        General: Normal range of motion.     Cervical back: Normal range of motion.  Skin:    General: Skin is warm and dry.     Capillary Refill: Capillary refill takes less than 2 seconds.  Neurological:     General: No focal deficit present.     Mental Status: He is alert and oriented to person, place, and time.  Psychiatric:        Mood and Affect: Mood normal.        Behavior: Behavior normal.        Thought Content: Thought content normal.        Judgment: Judgment normal.    BP (!) 133/50    Pulse 85    Temp 99.1 F (37.3 C)    Ht _0  (1.854 m)    Wt 166 lb 8 oz (75.5 kg)    SpO2 97%    BMI 21.97 kg/m  Wt Readings from Last 3 Encounters:  07/24/21 166 lb 8 oz (75.5 kg)  02/24/21 171 lb 9.6 oz (77.8 kg)  09/17/20 173 lb (78.5 kg)     There are no preventive care reminders to display for this patient.   There are no preventive care reminders to display for this patient.   No results found for: TSH Lab Results  Component Value Date   WBC 8.5 02/25/2021   HGB 8.3 (L) 02/25/2021   HCT 28.4 (L) 02/25/2021   MCV 75 (L) 02/25/2021   PLT 622 (H) 02/25/2021   Lab Results  Component Value Date   NA 137 02/25/2021   K 4.0 02/25/2021   CO2 21 02/25/2021   GLUCOSE 105 (H) 02/25/2021   BUN 6 02/25/2021   CREATININE 0.80 02/25/2021   BILITOT 0.6 02/25/2021   ALKPHOS 119 02/25/2021   AST  33 02/25/2021   ALT 30 02/25/2021   PROT 7.8 02/25/2021   ALBUMIN 4.3 02/25/2021   CALCIUM 9.5 02/25/2021  EGFR 127 02/25/2021   No results found for: CHOL No results found for: HDL No results found for: LDLCALC No results found for: TRIG No results found for: CHOLHDL No results found for: HGBA1C    Assessment & Plan:   Problem List Items Addressed This Visit       Other   Chronic pain syndrome Stable   Relevant Orders   [id number] 11+Oxyco+Alc+Crt-Bund   Seasonal allergies Stable however is aware of season change and need for starting treatment early   Relevant Medications   fexofenadine-pseudoephedrine (ALLEGRA-D 24) 180-240 MG 24 hr tablet   Hb-SS disease without crisis ([place]) - Primary Ensure adequate hydration. Move frequently to reduce venous thromboembolism risk. Avoid situations that could lead to dehydration or could exacerbate pain Discussed S&S of infection, seizures, stroke acute chest, DVT and how important it is to seek medical attention Take medication as directed along with pain contract and overall compliance Discussed the risk related to opiate use (addition, tolerance and dependency)    Relevant Orders   Sickle Cell Panel   Hgb Fractionation Cascade   Other Visit Diagnoses     Screening for STD (sexually transmitted disease)       Relevant Orders   Chlamydia/Gonococcus/Trichomonas, NAA   HIV Antibody (routine testing w rflx)       No orders of the defined types were placed in this encounter.   Follow-up: Return in about 3 months (around 10/21/2021).    Vevelyn Francois, NP

## 2021-07-24 NOTE — Patient Instructions (Signed)
Sickle Cell Anemia, Adult ?Sickle cell anemia is a condition where your red blood cells are shaped like sickles. Red blood cells carry oxygen through the body. Sickle-shaped cells do not live as long as normal red blood cells. They also clump together and block blood from flowing through the blood vessels. This prevents the body from getting enough oxygen. Sickle cell anemia causes organ damage and pain. It also increases the risk of infection. ?Follow these instructions at home: ?Medicines ?Take over-the-counter and prescription medicines only as told by your doctor. ?If you were prescribed an antibiotic medicine, take it as told by your doctor. Do not stop taking the antibiotic even if you start to feel better. ?If you develop a fever, do not take medicines to lower the fever right away. Tell your doctor about the fever. ?Managing pain, stiffness, and swelling ?Try these methods to help with pain: ?Use a heating pad. ?Take a warm bath. ?Distract yourself, such as by watching TV. ?Eating and drinking ?Drink enough fluid to keep your pee (urine) clear or pale yellow. Drink more in hot weather and during exercise. ?Limit or avoid alcohol. ?Eat a healthy diet. Eat plenty of fruits, vegetables, whole grains, and lean protein. ?Take vitamins and supplements as told by your doctor. ?Traveling ?When traveling, keep these with you: ?Your medical information. ?The names of your doctors. ?Your medicines. ?If you need to take an airplane, talk to your doctor first. ?Activity ?Rest often. ?Avoid exercises that make your heart beat much faster, such as jogging. ?General instructions ?Do not use products that have nicotine or tobacco, such as cigarettes and e-cigarettes. If you need help quitting, ask your doctor. ?Consider wearing a medical alert bracelet. ?Avoid being in high places (high altitudes), such as mountains. ?Avoid very hot or cold temperatures. ?Avoid places where the temperature changes a lot. ?Keep all follow-up  visits as told by your doctor. This is important. ?Contact a doctor if: ?A joint hurts. ?Your feet or hands hurt or swell. ?You feel tired (fatigued). ?Get help right away if: ?You have symptoms of infection. These include: ?Fever. ?Chills. ?Being very tired. ?Irritability. ?Poor eating. ?Throwing up (vomiting). ?You feel dizzy or faint. ?You have new stomach pain, especially on the left side. ?You have a an erection (priapism) that lasts more than 4 hours. ?You have numbness in your arms or legs. ?You have a hard time moving your arms or legs. ?You have trouble talking. ?You have pain that does not go away when you take medicine. ?You are short of breath. ?You are breathing fast. ?You have a long-term cough. ?You have pain in your chest. ?You have a bad headache. ?You have a stiff neck. ?Your stomach looks bloated even though you did not eat much. ?Your skin is pale. ?You suddenly cannot see well. ?Summary ?Sickle cell anemia is a condition where your red blood cells are shaped like sickles. ?Follow your doctor's advice on ways to manage pain, food to eat, activities to do, and steps to take for safe travel. ?Get medical help right away if you have any signs of infection, such as a fever. ?This information is not intended to replace advice given to you by your health care provider. Make sure you discuss any questions you have with your health care provider. ?Document Revised: 10/26/2019 Document Reviewed: 10/26/2019 ?Elsevier Patient Education ? 2022 Elsevier Inc. ? ?

## 2021-07-25 ENCOUNTER — Ambulatory Visit: Payer: Self-pay | Admitting: Nurse Practitioner

## 2021-07-25 ENCOUNTER — Other Ambulatory Visit: Payer: Self-pay | Admitting: Nurse Practitioner

## 2021-07-25 ENCOUNTER — Encounter: Payer: Self-pay | Admitting: Nurse Practitioner

## 2021-07-25 DIAGNOSIS — D571 Sickle-cell disease without crisis: Secondary | ICD-10-CM

## 2021-07-25 DIAGNOSIS — G894 Chronic pain syndrome: Secondary | ICD-10-CM

## 2021-07-25 MED ORDER — OXYCODONE-ACETAMINOPHEN 10-325 MG PO TABS: 1.0000 | ORAL_TABLET | Freq: Three times a day (TID) | ORAL | 0 refills | Status: DC | PRN

## 2021-07-26 LAB — DRUG SCREEN 764883 11+OXYCO+ALC+CRT-BUND
Amphetamines, Urine: NEGATIVE ng/mL
BENZODIAZ UR QL: NEGATIVE ng/mL
Barbiturate: NEGATIVE ng/mL
Cannabinoid Quant, Ur: NEGATIVE ng/mL
Cocaine (Metabolite): NEGATIVE ng/mL
Creatinine: 77 mg/dL (ref 20.0–300.0)
Ethanol: NEGATIVE %
Meperidine: NEGATIVE ng/mL
Methadone Screen, Urine: NEGATIVE ng/mL
OPIATE SCREEN URINE: NEGATIVE ng/mL
Oxycodone/Oxymorphone, Urine: NEGATIVE ng/mL
Phencyclidine: NEGATIVE ng/mL
Propoxyphene: NEGATIVE ng/mL
Tramadol: NEGATIVE ng/mL
pH, Urine: 6.5 (ref 4.5–8.9)

## 2021-07-28 LAB — CHLAMYDIA/GONOCOCCUS/TRICHOMONAS, NAA
Chlamydia by NAA: NEGATIVE
Gonococcus by NAA: NEGATIVE
Trich vag by NAA: NEGATIVE

## 2021-08-01 LAB — CMP14+CBC/D/PLT+FER+RETIC+V...
ALT: 22 IU/L (ref 0–44)
AST: 30 IU/L (ref 0–40)
Albumin/Globulin Ratio: 1.3 (ref 1.2–2.2)
Albumin: 4.5 g/dL (ref 4.1–5.2)
Alkaline Phosphatase: 86 IU/L (ref 44–121)
BUN/Creatinine Ratio: 9 (ref 9–20)
BUN: 7 mg/dL (ref 6–20)
Basophils Absolute: 0.1 10*3/uL (ref 0.0–0.2)
Basos: 1 %
Bilirubin Total: 0.8 mg/dL (ref 0.0–1.2)
CO2: 22 mmol/L (ref 20–29)
Calcium: 9.5 mg/dL (ref 8.7–10.2)
Chloride: 103 mmol/L (ref 96–106)
Creatinine, Ser: 0.75 mg/dL — ABNORMAL LOW (ref 0.76–1.27)
EOS (ABSOLUTE): 0.1 10*3/uL (ref 0.0–0.4)
Eos: 2 %
Ferritin: 18 ng/mL — ABNORMAL LOW (ref 30–400)
Globulin, Total: 3.5 g/dL (ref 1.5–4.5)
Glucose: 96 mg/dL (ref 70–99)
Hematocrit: 28.6 % — ABNORMAL LOW (ref 37.5–51.0)
Hemoglobin: 8.3 g/dL — ABNORMAL LOW (ref 13.0–17.7)
Immature Grans (Abs): 0 10*3/uL (ref 0.0–0.1)
Immature Granulocytes: 0 %
Lymphocytes Absolute: 3.9 10*3/uL — ABNORMAL HIGH (ref 0.7–3.1)
Lymphs: 47 %
MCH: 21.8 pg — ABNORMAL LOW (ref 26.6–33.0)
MCHC: 29 g/dL — ABNORMAL LOW (ref 31.5–35.7)
MCV: 75 fL — ABNORMAL LOW (ref 79–97)
Monocytes Absolute: 0.9 10*3/uL (ref 0.1–0.9)
Monocytes: 11 %
Neutrophils Absolute: 3.2 10*3/uL (ref 1.4–7.0)
Neutrophils: 39 %
Platelets: 595 10*3/uL — ABNORMAL HIGH (ref 150–450)
Potassium: 3.9 mmol/L (ref 3.5–5.2)
RBC: 3.81 x10E6/uL — ABNORMAL LOW (ref 4.14–5.80)
RDW: 22.9 % — ABNORMAL HIGH (ref 11.6–15.4)
Retic Ct Pct: 4.9 % — ABNORMAL HIGH (ref 0.6–2.6)
Sodium: 138 mmol/L (ref 134–144)
Total Protein: 8 g/dL (ref 6.0–8.5)
Vit D, 25-Hydroxy: 25.8 ng/mL — ABNORMAL LOW (ref 30.0–100.0)
WBC: 8.1 10*3/uL (ref 3.4–10.8)
eGFR: 129 mL/min/{1.73_m2} (ref >59)

## 2021-08-01 LAB — HGB FRACTIONATION CASCADE: Hgb A2: 3.7 % — ABNORMAL HIGH (ref 1.8–3.2)

## 2021-08-01 LAB — HIV ANTIBODY (ROUTINE TESTING W REFLEX): HIV Screen 4th Generation wRfx: NONREACTIVE

## 2021-08-01 LAB — HGB FRAC BY HPLC+SOLUBILITY
Hgb A: 0 % — ABNORMAL LOW (ref 96.4–98.8)
Hgb C: 0 %
Hgb E: 0 %
Hgb F: 0 % (ref 0.0–2.0)
Hgb S: 96.3 % — ABNORMAL HIGH
Hgb Solubility: POSITIVE — AB
Hgb Variant: 0 %

## 2021-08-07 ENCOUNTER — Other Ambulatory Visit: Payer: Self-pay | Admitting: Nurse Practitioner

## 2021-08-07 DIAGNOSIS — G894 Chronic pain syndrome: Secondary | ICD-10-CM

## 2021-08-07 DIAGNOSIS — D571 Sickle-cell disease without crisis: Secondary | ICD-10-CM

## 2021-08-07 MED ORDER — OXYCODONE-ACETAMINOPHEN 10-325 MG PO TABS: 1.0000 | ORAL_TABLET | Freq: Three times a day (TID) | ORAL | 0 refills | Status: DC | PRN

## 2021-08-25 ENCOUNTER — Other Ambulatory Visit: Payer: Self-pay | Admitting: Nurse Practitioner

## 2021-08-25 DIAGNOSIS — G894 Chronic pain syndrome: Secondary | ICD-10-CM

## 2021-08-25 DIAGNOSIS — D571 Sickle-cell disease without crisis: Secondary | ICD-10-CM

## 2021-08-25 MED ORDER — OXYCODONE-ACETAMINOPHEN 10-325 MG PO TABS: 1.0000 | ORAL_TABLET | Freq: Three times a day (TID) | ORAL | 0 refills | Status: DC | PRN

## 2021-09-10 ENCOUNTER — Telehealth: Payer: Self-pay | Admitting: Nurse Practitioner

## 2021-09-10 NOTE — Telephone Encounter (Signed)
Oxycodone refill request.

## 2021-09-11 ENCOUNTER — Telehealth: Payer: Self-pay

## 2021-09-11 NOTE — Telephone Encounter (Signed)
Oxycodone  °

## 2021-09-12 ENCOUNTER — Other Ambulatory Visit: Payer: Self-pay | Admitting: Internal Medicine

## 2021-09-12 DIAGNOSIS — D571 Sickle-cell disease without crisis: Secondary | ICD-10-CM

## 2021-09-12 DIAGNOSIS — G894 Chronic pain syndrome: Secondary | ICD-10-CM

## 2021-09-12 MED ORDER — OXYCODONE-ACETAMINOPHEN 10-325 MG PO TABS: 1.0000 | ORAL_TABLET | Freq: Three times a day (TID) | ORAL | 0 refills | Status: DC | PRN

## 2021-09-30 ENCOUNTER — Telehealth: Payer: Self-pay

## 2021-09-30 ENCOUNTER — Other Ambulatory Visit: Payer: Self-pay | Admitting: Internal Medicine

## 2021-09-30 DIAGNOSIS — D571 Sickle-cell disease without crisis: Secondary | ICD-10-CM

## 2021-09-30 DIAGNOSIS — G894 Chronic pain syndrome: Secondary | ICD-10-CM

## 2021-09-30 MED ORDER — OXYCODONE-ACETAMINOPHEN 10-325 MG PO TABS: 1.0000 | ORAL_TABLET | Freq: Three times a day (TID) | ORAL | 0 refills | Status: AC | PRN

## 2021-09-30 NOTE — Telephone Encounter (Signed)
Oxycodone ? ?Pt is stated that [facility] doesn't have OXY in stock ? ?Please re-send to [facility] on [address] Please ? ?

## 2021-10-20 ENCOUNTER — Ambulatory Visit: Payer: Self-pay | Admitting: Nurse Practitioner

## 2021-10-21 ENCOUNTER — Encounter: Payer: Self-pay | Admitting: Nurse Practitioner

## 2021-10-21 ENCOUNTER — Ambulatory Visit (INDEPENDENT_AMBULATORY_CARE_PROVIDER_SITE_OTHER): Payer: Medicaid Other | Admitting: Nurse Practitioner

## 2021-10-21 VITALS — BP 119/64 | HR 76 | Temp 99.0°F | Ht 73.0 in | Wt 171.6 lb

## 2021-10-21 DIAGNOSIS — D571 Sickle-cell disease without crisis: Secondary | ICD-10-CM | POA: Diagnosis not present

## 2021-10-21 DIAGNOSIS — G894 Chronic pain syndrome: Secondary | ICD-10-CM | POA: Diagnosis not present

## 2021-10-21 DIAGNOSIS — Z113 Encounter for screening for infections with a predominantly sexual mode of transmission: Secondary | ICD-10-CM

## 2021-10-21 LAB — POCT URINALYSIS DIP (CLINITEK)
Bilirubin, UA: NEGATIVE
Blood, UA: NEGATIVE
Glucose, UA: NEGATIVE mg/dL
Ketones, POC UA: NEGATIVE mg/dL
Leukocytes, UA: NEGATIVE
Nitrite, UA: NEGATIVE
POC PROTEIN,UA: NEGATIVE
Spec Grav, UA: 1.02 (ref 1.010–1.025)
Urobilinogen, UA: 0.2 E.U./dL
pH, UA: 7 (ref 5.0–8.0)

## 2021-10-21 MED ORDER — OXYCODONE-ACETAMINOPHEN 10-325 MG PO TABS: 1.0000 | ORAL_TABLET | Freq: Three times a day (TID) | ORAL | 0 refills | Status: DC | PRN

## 2021-10-21 NOTE — Patient Instructions (Signed)
You were seen today in the [facility] for reevaluation of sickle cell and STD screening. Labs were collected, results will be available via MyChart or, if abnormal, you will be contacted by clinic staff. You were prescribed medications, please take as directed. Please follow up in 3 mths for reevaluation of sickle cell.  ?

## 2021-10-21 NOTE — Progress Notes (Signed)
? ?[place] ?[place], [place]  [address] ?Phone:  [phone]   Fax:  [phone] ?Subjective:  ? Patient ID: Derrick Wu, male    DOB: 20-Oct-1995, 26 y.o.   MRN: [id number] ? ?Chief Complaint  ?Patient presents with  ? Follow-up  ?  Patient is here today for her follow up visit with no concerns to discuss. Patient would like STD screening as well.  ? ?HPI ?Derrick Wu 26 y.o. male  has a past medical history of Asthma, Headache(784.0), Pneumonia, Seasonal allergies, Sickle cell anemia ([place]), Vision abnormalities, and Vitamin D deficiency (09/2020). To the [facility] for follow up for sickle cell and STD screening.  ? ?States that he has had one partner in the past 6 mths, heterosexual preference, with intermittent protection. Denies any penile concerns or STD symptoms.  ? ?Endorses monitoring diet, exercising regularly and maintaining hydration. Does not adhere to a diet regimen. Denies any other concerns today.  ? ?Denies any fatigue, chest pain, shortness of breath, HA or dizziness. Denies any blurred vision, numbness or tingling. ? ?Past Medical History:  ?Diagnosis Date  ? Asthma   ? Headache(784.0)   ? Pneumonia   ? Seasonal allergies   ? Sickle cell anemia (HCC)   ? Vision abnormalities   ? Hx: of wears glasses  ? Vitamin D deficiency 09/2020  ? ? ?Past Surgical History:  ?Procedure Laterality Date  ? TOOTH EXTRACTION N/A 05/26/2013  ? Procedure: EXTRACTION WISDOM TEETH - one, sixteen, [age] and [age].;  Surgeon: Gae Bon, DDS;  Location: [place];  Service: Oral Surgery;  Laterality: N/A;  ? ? ?Family History  ?Problem Relation Age of Onset  ? Diabetes Mother   ? Hypertension Mother   ? Vision loss Mother   ? Diabetes Father   ? Hypertension Father   ? Vision loss Father   ? ? ?Social History  ? ?Socioeconomic History  ? Marital status: Single  ?  Spouse name: Not on file  ? Number of children: Not on file  ? Years of education: Not on file  ?  Highest education level: Not on file  ?Occupational History  ? Not on file  ?Tobacco Use  ? Smoking status: Never  ? Smokeless tobacco: Never  ?Vaping Use  ? Vaping Use: Never used  ?Substance and Sexual Activity  ? Alcohol use: No  ? Drug use: No  ? Sexual activity: Yes  ?  Birth control/protection: Condom  ?Other Topics Concern  ? Not on file  ?Social History Narrative  ? Pt is in 11th grade 2014-15  ? ?Social Determinants of Health  ? ?Financial Resource Strain: Not on file  ?Food Insecurity: Not on file  ?Transportation Needs: Not on file  ?Physical Activity: Not on file  ?Stress: Not on file  ?Social Connections: Not on file  ?Intimate Partner Violence: Not on file  ? ? ?Outpatient Medications Prior to Visit  ?Medication Sig Dispense Refill  ? fexofenadine-pseudoephedrine (ALLEGRA-D 24) 180-240 MG 24 hr tablet Take 1 tablet by mouth daily. 30 tablet 6  ? folic acid (FOLVITE) 1 MG tablet Take 1 tablet (1 mg total) by mouth daily. 90 tablet 3  ? Multiple Vitamins-Minerals (ONE DAILY MULTIVIT-MIN ADULT) TABS Take 1 tablet by mouth daily. 90 tablet 1  ? Vitamin D, Ergocalciferol, (DRISDOL) 1.25 MG (50000 UNIT) CAPS capsule Take 1 capsule (50,000 Units total) by mouth every 7 (seven) days. 12 capsule 3  ? oxyCODONE-acetaminophen (PERCOCET)  10-325 MG tablet Take 1 tablet by mouth every 8 (eight) hours as needed for pain. Take 1 tablet by mouth every 8 hours as needed for 15 days.    ? ibuprofen (ADVIL) 800 MG tablet Take 1 tablet (800 mg total) by mouth every 8 (eight) hours as needed. (Patient not taking: Reported on 10/21/2021) 30 tablet 6  ? tiZANidine (ZANAFLEX) 4 MG tablet Take 1 tablet (4 mg total) by mouth 3 (three) times daily. (Patient not taking: Reported on 02/24/2021) 90 tablet 6  ? ?No facility-administered medications prior to visit.  ? ? ?Allergies  ?Allergen Reactions  ? Mushroom Extract Complex Nausea And Vomiting  ? ? ?Review of Systems  ?Constitutional: Negative.  Negative for chills, fever and  malaise/fatigue.  ?Respiratory: Negative.  Negative for cough and shortness of breath.   ?Cardiovascular: Negative.  Negative for chest pain, palpitations and leg swelling.  ?Gastrointestinal:  Negative for abdominal pain, blood in stool, constipation, diarrhea, nausea and vomiting.  ?Musculoskeletal: Negative.   ?Skin: Negative.   ?Neurological: Negative.   ?Psychiatric/Behavioral: Negative.  Negative for depression. The patient is not nervous/anxious.   ?All other systems reviewed and are negative. ? ?   ?Objective:  ?  ?Physical Exam ?Constitutional:   ?   General: He is not in acute distress. ?   Appearance: Normal appearance. He is normal weight.  ?HENT:  ?   Head: Normocephalic.  ?Neck:  ?   Vascular: No carotid bruit.  ?Cardiovascular:  ?   Rate and Rhythm: Normal rate and regular rhythm.  ?   Pulses: Normal pulses.  ?   Heart sounds: Normal heart sounds.  ?   Comments: No obvious peripheral edema ?Pulmonary:  ?   Effort: Pulmonary effort is normal.  ?   Breath sounds: Normal breath sounds.  ?Musculoskeletal:     ?   General: No swelling, tenderness, deformity or signs of injury. Normal range of motion.  ?   Cervical back: Normal range of motion and neck supple. No rigidity or tenderness.  ?   Right lower leg: No edema.  ?   Left lower leg: No edema.  ?Lymphadenopathy:  ?   Cervical: No cervical adenopathy.  ?Skin: ?   General: Skin is warm and dry.  ?   Capillary Refill: Capillary refill takes less than 2 seconds.  ?Neurological:  ?   General: No focal deficit present.  ?   Mental Status: He is alert and oriented to person, place, and time.  ?Psychiatric:     ?   Mood and Affect: Mood normal.     ?   Behavior: Behavior normal.     ?   Thought Content: Thought content normal.     ?   Judgment: Judgment normal.  ? ? ?BP 119/64   Pulse 76   Temp 99 ?F (37.2 ?C)   Ht $R'6\' 1"'wz$  (1.854 m)   Wt 171 lb 9.6 oz (77.8 kg)   SpO2 100%   BMI 22.64 kg/m?  ?Wt Readings from Last 3 Encounters:  ?10/21/21 171 lb 9.6 oz  (77.8 kg)  ?07/24/21 166 lb 8 oz (75.5 kg)  ?02/24/21 171 lb 9.6 oz (77.8 kg)  ? ? ?Immunization History  ?Administered Date(s) Administered  ? Meningococcal Conjugate 02/22/2007, 11/05/2010  ? Pneumococcal Polysaccharide-23 11/23/2001, 02/22/2007, 01/08/2016  ? ? ?Diabetic Foot Exam - Simple   ?No data filed ?  ? ? ?No results found for: TSH ?Lab Results  ?Component Value Date  ? WBC  8.0 10/21/2021  ? HGB 9.0 (L) 10/21/2021  ? HCT 30.6 (L) 10/21/2021  ? MCV 72 (L) 10/21/2021  ? PLT 607 (H) 10/21/2021  ? ?Lab Results  ?Component Value Date  ? NA 137 10/21/2021  ? K 4.6 10/21/2021  ? CO2 22 10/21/2021  ? GLUCOSE 92 10/21/2021  ? BUN 6 10/21/2021  ? CREATININE 0.75 (L) 10/21/2021  ? BILITOT 0.9 10/21/2021  ? ALKPHOS 92 10/21/2021  ? AST 38 10/21/2021  ? ALT 30 10/21/2021  ? PROT 8.0 10/21/2021  ? ALBUMIN 4.5 10/21/2021  ? CALCIUM 9.4 10/21/2021  ? EGFR 128 10/21/2021  ? ?No results found for: CHOL ?No results found for: HDL ?No results found for: [place] ?No results found for: TRIG ?No results found for: CHOLHDL ?No results found for: HGBA1C ? ?   ?Assessment & Plan:  ? ?Problem List Items Addressed This Visit   ? ?  ? Other  ? Chronic pain syndrome  ? Relevant Medications  ? oxyCODONE-acetaminophen (PERCOCET) 10-325 MG tablet, medication refilled   ? Other Relevant Orders  ? POCT URINALYSIS DIP (CLINITEK) (Completed)  ? 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 Relevant Medications  ? oxyCODONE-acetaminophen (PERCOCET) 10-325 MG tablet  ? Other Relevant Orders  ? POCT URINALYSIS DIP (CLINITEK) (Completed)  ? CBC with Differential/Platelet (Completed)  ? Comprehensive metabolic panel (Completed)  ? Vitamin D, 25-hydroxy (Completed)  ? Folate (Completed) ?Encouraged to maintain balanced diet and appropriate hydration   ? ?Other Visit Diagnoses   ? ? Screening examination for STD (sexually transmitted disease)      ? Relevant Orders  ? RPR+HIV+GC+CT Panel (Completed)  ? HSV(herpes simplex vrs) 1+2 ab-IgG  (Completed) ?Discussed and encouraged safe sex practices   ? ?Follow up in 3 mths for reevaluation of sickle cell, sooner as needed   ? ? ?I am having Derrick Wu maintain his ibuprofen, tiZANidine, folic acid, Vit

## 2021-10-22 LAB — CBC WITH DIFFERENTIAL/PLATELET
Basophils Absolute: 0.1 10*3/uL (ref 0.0–0.2)
Basos: 2 %
EOS (ABSOLUTE): 0.6 10*3/uL — ABNORMAL HIGH (ref 0.0–0.4)
Eos: 7 %
Hematocrit: 30.6 % — ABNORMAL LOW (ref 37.5–51.0)
Hemoglobin: 9 g/dL — ABNORMAL LOW (ref 13.0–17.7)
Immature Grans (Abs): 0 10*3/uL (ref 0.0–0.1)
Immature Granulocytes: 0 %
Lymphocytes Absolute: 2.6 10*3/uL (ref 0.7–3.1)
Lymphs: 32 %
MCH: 21.3 pg — ABNORMAL LOW (ref 26.6–33.0)
MCHC: 29.4 g/dL — ABNORMAL LOW (ref 31.5–35.7)
MCV: 72 fL — ABNORMAL LOW (ref 79–97)
Monocytes Absolute: 0.8 10*3/uL (ref 0.1–0.9)
Monocytes: 10 %
NRBC: 1 % — ABNORMAL HIGH (ref 0–0)
Neutrophils Absolute: 3.9 10*3/uL (ref 1.4–7.0)
Neutrophils: 49 %
Platelets: 607 10*3/uL — ABNORMAL HIGH (ref 150–450)
RBC: 4.23 x10E6/uL (ref 4.14–5.80)
RDW: 21.6 % — ABNORMAL HIGH (ref 11.6–15.4)
WBC: 8 10*3/uL (ref 3.4–10.8)

## 2021-10-22 LAB — COMPREHENSIVE METABOLIC PANEL
ALT: 30 IU/L (ref 0–44)
AST: 38 IU/L (ref 0–40)
Albumin/Globulin Ratio: 1.3 (ref 1.2–2.2)
Albumin: 4.5 g/dL (ref 4.1–5.2)
Alkaline Phosphatase: 92 IU/L (ref 44–121)
BUN/Creatinine Ratio: 8 — ABNORMAL LOW (ref 9–20)
BUN: 6 mg/dL (ref 6–20)
Bilirubin Total: 0.9 mg/dL (ref 0.0–1.2)
CO2: 22 mmol/L (ref 20–29)
Calcium: 9.4 mg/dL (ref 8.7–10.2)
Chloride: 100 mmol/L (ref 96–106)
Creatinine, Ser: 0.75 mg/dL — ABNORMAL LOW (ref 0.76–1.27)
Globulin, Total: 3.5 g/dL (ref 1.5–4.5)
Glucose: 92 mg/dL (ref 70–99)
Potassium: 4.6 mmol/L (ref 3.5–5.2)
Sodium: 137 mmol/L (ref 134–144)
Total Protein: 8 g/dL (ref 6.0–8.5)
eGFR: 128 mL/min/{1.73_m2} (ref >59)

## 2021-10-22 LAB — VITAMIN D 25 HYDROXY (VIT D DEFICIENCY, FRACTURES): Vit D, 25-Hydroxy: 62 ng/mL (ref 30.0–100.0)

## 2021-10-22 LAB — HSV(HERPES SIMPLEX VRS) I + II AB-IGG
HSV 1 Glycoprotein G Ab, IgG: 19.8 index — ABNORMAL HIGH (ref 0.00–0.90)
HSV 2 IgG, Type Spec: <0.91 index (ref 0.00–0.90)

## 2021-10-22 LAB — FOLATE: Folate: >20 ng/mL (ref >3.0)

## 2021-10-23 LAB — RPR+HIV+GC+CT PANEL
Chlamydia trachomatis, NAA: NEGATIVE
HIV Screen 4th Generation wRfx: NONREACTIVE
Neisseria Gonorrhoeae by PCR: NEGATIVE
RPR Ser Ql: NONREACTIVE

## 2021-11-11 ENCOUNTER — Telehealth: Payer: Self-pay

## 2021-11-11 ENCOUNTER — Other Ambulatory Visit: Payer: Self-pay | Admitting: Nurse Practitioner

## 2021-11-11 DIAGNOSIS — D571 Sickle-cell disease without crisis: Secondary | ICD-10-CM

## 2021-11-11 MED ORDER — OXYCODONE-ACETAMINOPHEN 10-325 MG PO TABS: 1.0000 | ORAL_TABLET | Freq: Three times a day (TID) | ORAL | 0 refills | Status: DC | PRN

## 2021-11-11 NOTE — Telephone Encounter (Signed)
Oxycodone  °

## 2021-11-14 ENCOUNTER — Telehealth: Payer: Self-pay | Admitting: Nurse Practitioner

## 2021-11-14 ENCOUNTER — Other Ambulatory Visit: Payer: Self-pay | Admitting: Nurse Practitioner

## 2021-11-14 DIAGNOSIS — D571 Sickle-cell disease without crisis: Secondary | ICD-10-CM

## 2021-11-14 MED ORDER — OXYCODONE-ACETAMINOPHEN 10-325 MG PO TABS: 1.0000 | ORAL_TABLET | Freq: Three times a day (TID) | ORAL | 0 refills | Status: DC | PRN

## 2021-11-14 NOTE — Telephone Encounter (Signed)
Pt requesting oxycodone refill be sent to [facility] [address] [facility] He has been out for a couple days.

## 2021-11-19 ENCOUNTER — Telehealth: Payer: Self-pay

## 2021-11-19 NOTE — Telephone Encounter (Signed)
Pt said his med was on back order on current pharmacy  However its at [facility] on [address]  Oxycodone

## 2021-11-24 ENCOUNTER — Telehealth: Payer: Self-pay | Admitting: Nurse Practitioner

## 2021-11-24 NOTE — Telephone Encounter (Signed)
Requesting provider DEA # be given to pharmacy [facility] on [address] [phone]

## 2021-11-25 ENCOUNTER — Telehealth: Payer: Self-pay

## 2021-11-25 NOTE — Telephone Encounter (Signed)
Pt said that the pharmacies are constantly RUNNING out of this med   Oxycodone  [facility] [address] is where it is now

## 2021-12-15 ENCOUNTER — Telehealth: Payer: Self-pay | Admitting: Nurse Practitioner

## 2021-12-15 NOTE — Telephone Encounter (Signed)
Oxycodone refill request.

## 2021-12-17 ENCOUNTER — Other Ambulatory Visit: Payer: Self-pay | Admitting: Nurse Practitioner

## 2021-12-17 DIAGNOSIS — D571 Sickle-cell disease without crisis: Secondary | ICD-10-CM

## 2021-12-17 MED ORDER — OXYCODONE-ACETAMINOPHEN 10-325 MG PO TABS: 1.0000 | ORAL_TABLET | Freq: Three times a day (TID) | ORAL | 0 refills | Status: DC | PRN

## 2022-01-01 ENCOUNTER — Other Ambulatory Visit: Payer: Self-pay | Admitting: Family Medicine

## 2022-01-01 ENCOUNTER — Telehealth: Payer: Self-pay

## 2022-01-01 DIAGNOSIS — D571 Sickle-cell disease without crisis: Secondary | ICD-10-CM

## 2022-01-01 MED ORDER — OXYCODONE-ACETAMINOPHEN 10-325 MG PO TABS: 1.0000 | ORAL_TABLET | Freq: Three times a day (TID) | ORAL | 0 refills | Status: DC | PRN

## 2022-01-01 NOTE — Telephone Encounter (Signed)
Oxycodone  °

## 2022-01-01 NOTE — Progress Notes (Signed)
Reviewed PDMP substance reporting system prior to prescribing opiate medications. No inconsistencies noted.  Meds ordered this encounter  Medications   oxyCODONE-acetaminophen (PERCOCET) 10-325 MG tablet    Sig: Take 1 tablet by mouth every 8 (eight) hours as needed for pain. Take 1 tablet by mouth every 8 hours as needed for 15 days.    Dispense:  30 tablet    Refill:  0    Order Specific Question:   Supervising Provider    Answer:   Quentin Angst [[id number]]   Nolon Nations  APRN, MSN, FNP-C Patient Care [facility] [address] [place], [place] [address] [phone]

## 2022-01-16 ENCOUNTER — Other Ambulatory Visit: Payer: Self-pay | Admitting: Family Medicine

## 2022-01-16 ENCOUNTER — Telehealth: Payer: Self-pay

## 2022-01-16 DIAGNOSIS — D571 Sickle-cell disease without crisis: Secondary | ICD-10-CM

## 2022-01-16 MED ORDER — OXYCODONE-ACETAMINOPHEN 10-325 MG PO TABS: 1.0000 | ORAL_TABLET | Freq: Three times a day (TID) | ORAL | 0 refills | Status: DC | PRN

## 2022-01-16 NOTE — Progress Notes (Signed)
Reviewed PDMP substance reporting system prior to prescribing opiate medications. No inconsistencies noted.  Meds ordered this encounter  Medications   oxyCODONE-acetaminophen (PERCOCET) 10-325 MG tablet    Sig: Take 1 tablet by mouth every 8 (eight) hours as needed for pain. Take 1 tablet by mouth every 8 hours as needed for 15 days.    Dispense:  30 tablet    Refill:  0    Order Specific Question:   Supervising Provider    Answer:   Quentin Angst [[id number]]   Nolon Nations  APRN, MSN, FNP-C Patient Care [facility] [address] [place], [place] [address] [phone]

## 2022-01-16 NOTE — Telephone Encounter (Signed)
Oxycodone  °

## 2022-01-19 ENCOUNTER — Encounter: Payer: Self-pay | Admitting: Nurse Practitioner

## 2022-01-19 ENCOUNTER — Ambulatory Visit (INDEPENDENT_AMBULATORY_CARE_PROVIDER_SITE_OTHER): Payer: Medicaid Other | Admitting: Nurse Practitioner

## 2022-01-19 VITALS — BP 115/61 | HR 75 | Temp 98.0°F | Ht 73.0 in | Wt 168.2 lb

## 2022-01-19 DIAGNOSIS — D571 Sickle-cell disease without crisis: Secondary | ICD-10-CM | POA: Diagnosis not present

## 2022-01-19 DIAGNOSIS — Z111 Encounter for screening for respiratory tuberculosis: Secondary | ICD-10-CM | POA: Diagnosis not present

## 2022-01-19 NOTE — Progress Notes (Unsigned)
@Patient  ID: , male    DOB: 1995/11/05, 26 y.o.   MRN: [id number]  Chief Complaint  Patient presents with   Follow-up    Pt is here for follow up visit. Pt stated he needs his paper filled out for school today    Referring provider: [id number], NP   HPI       Allergies  Allergen Reactions   Mushroom Extract Complex Nausea And Vomiting    Immunization History  Administered Date(s) Administered   Meningococcal Conjugate 02/22/2007, 11/05/2010   Pneumococcal Polysaccharide-23 11/23/2001, 02/22/2007, 01/08/2016    Past Medical History:  Diagnosis Date   Asthma    Headache(784.0)    Pneumonia    Seasonal allergies    Sickle cell anemia (HCC)    Vision abnormalities    Hx: of wears glasses   Vitamin D deficiency 09/2020    Tobacco History: Social History   Tobacco Use  Smoking Status Never  Smokeless Tobacco Never   Counseling given: Not Answered   Outpatient Encounter Medications as of 01/19/2022  Medication Sig   folic acid (FOLVITE) 1 MG tablet Take 1 tablet (1 mg total) by mouth daily.   Multiple Vitamins-Minerals (ONE DAILY MULTIVIT-MIN ADULT) TABS Take 1 tablet by mouth daily.   oxyCODONE-acetaminophen (PERCOCET) 10-325 MG tablet Take 1 tablet by mouth every 8 (eight) hours as needed for pain. Take 1 tablet by mouth every 8 hours as needed for 15 days.   fexofenadine-pseudoephedrine (ALLEGRA-D 24) 180-240 MG 24 hr tablet Take 1 tablet by mouth daily. (Patient not taking: Reported on 01/19/2022)   ibuprofen (ADVIL) 800 MG tablet Take 1 tablet (800 mg total) by mouth every 8 (eight) hours as needed. (Patient not taking: Reported on 10/21/2021)   tiZANidine (ZANAFLEX) 4 MG tablet Take 1 tablet (4 mg total) by mouth 3 (three) times daily. (Patient not taking: Reported on 02/24/2021)   No facility-administered encounter medications on file as of 01/19/2022.     Review of Systems  Review of Systems     Physical Exam  BP 115/61 (BP  Location: Right Arm, Patient Position: Sitting, Cuff Size: Normal)   Pulse 75   Temp 98 F (36.7 C)   Ht 6\' 1"  (1.854 m)   Wt 168 lb 4 oz (76.3 kg)   SpO2 97%   BMI 22.20 kg/m   Wt Readings from Last 5 Encounters:  01/19/22 168 lb 4 oz (76.3 kg)  10/21/21 171 lb 9.6 oz (77.8 kg)  07/24/21 166 lb 8 oz (75.5 kg)  02/24/21 171 lb 9.6 oz (77.8 kg)  09/17/20 173 lb (78.5 kg)     Physical Exam   Lab Results:  CBC    Component Value Date/Time   WBC 8.0 10/21/2021 1009   WBC 7.7 01/04/2017 1448   RBC 4.23 10/21/2021 1009   RBC 3.50 (L) 01/04/2017 1448   RBC 3.50 (L) 01/04/2017 1448   HGB 9.0 (L) 10/21/2021 1009   HCT 30.6 (L) 10/21/2021 1009   PLT 607 (H) 10/21/2021 1009   MCV 72 (L) 10/21/2021 1009   MCH 21.3 (L) 10/21/2021 1009   MCH 22.6 (L) 01/04/2017 1448   MCHC 29.4 (L) 10/21/2021 1009   MCHC 30.2 (L) 01/04/2017 1448   RDW 21.6 (H) 10/21/2021 1009   LYMPHSABS 2.6 10/21/2021 1009   MONOABS 1,001 (H) 01/04/2017 1448   EOSABS 0.6 (H) 10/21/2021 1009   BASOSABS 0.1 10/21/2021 1009    BMET    Component Value Date/Time  NA 137 10/21/2021 1009   K 4.6 10/21/2021 1009   CL 100 10/21/2021 1009   CO2 22 10/21/2021 1009   GLUCOSE 92 10/21/2021 1009   GLUCOSE 91 01/04/2017 1448   BUN 6 10/21/2021 1009   CREATININE 0.75 (L) 10/21/2021 1009   CREATININE 0.68 01/04/2017 1448   CALCIUM 9.4 10/21/2021 1009   GFRNONAA 133 02/02/2020 1556   GFRNONAA >89 01/04/2017 1448   GFRAA 154 02/02/2020 1556   GFRAA >89 01/04/2017 1448    BNP No results found for: "BNP"  ProBNP No results found for: "PROBNP"  Imaging: No results found.   Assessment & Plan:   No problem-specific Assessment & Plan notes found for this encounter.     Ivonne Andrew, NP 01/19/2022

## 2022-01-21 ENCOUNTER — Encounter: Payer: Self-pay | Admitting: Nurse Practitioner

## 2022-01-21 DIAGNOSIS — Z111 Encounter for screening for respiratory tuberculosis: Secondary | ICD-10-CM | POA: Insufficient documentation

## 2022-01-21 NOTE — Patient Instructions (Addendum)
1. Screening-pulmonary TB  - QuantiFERON-TB Gold Plus  2. Hb-SS disease without crisis (HCC)  - Sickle Cell Panel   Follow up:  Follow up in 3 months or sooner if needed

## 2022-01-21 NOTE — Assessment & Plan Note (Signed)
-   QuantiFERON-TB Gold Plus  2. Hb-SS disease without crisis (HCC)  - Sickle Cell Panel   Follow up:  Follow up in 3 months or sooner if needed

## 2022-01-22 LAB — CMP14+CBC/D/PLT+FER+RETIC+V...
ALT: 57 IU/L — ABNORMAL HIGH (ref 0–44)
AST: 61 IU/L — ABNORMAL HIGH (ref 0–40)
Albumin/Globulin Ratio: 1.3 (ref 1.2–2.2)
Albumin: 4.7 g/dL (ref 4.3–5.2)
Alkaline Phosphatase: 102 IU/L (ref 44–121)
BUN/Creatinine Ratio: 7 — ABNORMAL LOW (ref 9–20)
BUN: 6 mg/dL (ref 6–20)
Basophils Absolute: 0.3 10*3/uL — ABNORMAL HIGH (ref 0.0–0.2)
Basos: 3 %
Bilirubin Total: 0.8 mg/dL (ref 0.0–1.2)
CO2: 24 mmol/L (ref 20–29)
Calcium: 9.8 mg/dL (ref 8.7–10.2)
Chloride: 102 mmol/L (ref 96–106)
Creatinine, Ser: 0.85 mg/dL (ref 0.76–1.27)
EOS (ABSOLUTE): 0.7 10*3/uL — ABNORMAL HIGH (ref 0.0–0.4)
Eos: 7 %
Ferritin: 39 ng/mL (ref 30–400)
Globulin, Total: 3.5 g/dL (ref 1.5–4.5)
Glucose: 98 mg/dL (ref 70–99)
Hematocrit: 30.3 % — ABNORMAL LOW (ref 37.5–51.0)
Hemoglobin: 8.9 g/dL — ABNORMAL LOW (ref 13.0–17.7)
Lymphocytes Absolute: 3.6 10*3/uL — ABNORMAL HIGH (ref 0.7–3.1)
Lymphs: 37 %
MCH: 21 pg — ABNORMAL LOW (ref 26.6–33.0)
MCHC: 29.4 g/dL — ABNORMAL LOW (ref 31.5–35.7)
MCV: 72 fL — ABNORMAL LOW (ref 79–97)
Monocytes Absolute: 1.1 10*3/uL — ABNORMAL HIGH (ref 0.1–0.9)
Monocytes: 11 %
NRBC: 2 % — ABNORMAL HIGH (ref 0–0)
Neutrophils Absolute: 4.1 10*3/uL (ref 1.4–7.0)
Neutrophils: 42 %
Platelets: 643 10*3/uL — ABNORMAL HIGH (ref 150–450)
Potassium: 4.8 mmol/L (ref 3.5–5.2)
RBC: 4.24 x10E6/uL (ref 4.14–5.80)
RDW: 24.4 % — ABNORMAL HIGH (ref 11.6–15.4)
Retic Ct Pct: 4.9 % — ABNORMAL HIGH (ref 0.6–2.6)
Sodium: 139 mmol/L (ref 134–144)
Total Protein: 8.2 g/dL (ref 6.0–8.5)
Vit D, 25-Hydroxy: 32.7 ng/mL (ref 30.0–100.0)
WBC: 9.7 10*3/uL (ref 3.4–10.8)
eGFR: 124 mL/min/{1.73_m2} (ref >59)

## 2022-01-22 LAB — QUANTIFERON-TB GOLD PLUS
QuantiFERON Mitogen Value: >10 IU/mL
QuantiFERON Nil Value: 0 IU/mL
QuantiFERON TB1 Ag Value: 0.05 IU/mL
QuantiFERON TB2 Ag Value: 0.04 IU/mL
QuantiFERON-TB Gold Plus: NEGATIVE

## 2022-01-27 ENCOUNTER — Other Ambulatory Visit: Payer: Self-pay | Admitting: Family Medicine

## 2022-01-27 ENCOUNTER — Ambulatory Visit (INDEPENDENT_AMBULATORY_CARE_PROVIDER_SITE_OTHER): Payer: Medicaid Other | Admitting: Family Medicine

## 2022-01-27 ENCOUNTER — Other Ambulatory Visit: Payer: Self-pay

## 2022-01-27 DIAGNOSIS — Z23 Encounter for immunization: Secondary | ICD-10-CM | POA: Diagnosis not present

## 2022-01-27 DIAGNOSIS — Z0000 Encounter for general adult medical examination without abnormal findings: Secondary | ICD-10-CM

## 2022-01-27 DIAGNOSIS — D571 Sickle-cell disease without crisis: Secondary | ICD-10-CM

## 2022-01-27 DIAGNOSIS — Z139 Encounter for screening, unspecified: Secondary | ICD-10-CM

## 2022-01-27 MED ORDER — OXYCODONE-ACETAMINOPHEN 10-325 MG PO TABS: 1.0000 | ORAL_TABLET | Freq: Three times a day (TID) | ORAL | 0 refills | Status: DC | PRN

## 2022-01-27 NOTE — Progress Notes (Signed)
Reviewed PDMP substance reporting system prior to prescribing opiate medications. No inconsistencies noted.  Meds ordered this encounter  Medications   oxyCODONE-acetaminophen (PERCOCET) 10-325 MG tablet    Sig: Take 1 tablet by mouth every 8 (eight) hours as needed for pain. Take 1 tablet by mouth every 8 hours as needed for 15 days.    Dispense:  30 tablet    Refill:  0    Order Specific Question:   Supervising Provider    Answer:   JEGEDE, OLUGBEMIGA E [[id number]]   Aziz Slape Moore Ezelle Surprenant  APRN, MSN, FNP-C Patient Care [facility] [address]  [place], [place] [address] [phone]  

## 2022-01-27 NOTE — Progress Notes (Signed)
Orders Placed This Encounter  Procedures   Hepatitis B Surface AntiBODY    Standing Status:   Future    Standing Expiration Date:   01/28/2023   Nolon Nations  APRN, MSN, FNP-C Patient Care [facility] [address] [place], [place] [address] [phone]

## 2022-01-30 ENCOUNTER — Other Ambulatory Visit: Payer: Self-pay | Admitting: Nurse Practitioner

## 2022-01-30 DIAGNOSIS — D571 Sickle-cell disease without crisis: Secondary | ICD-10-CM

## 2022-02-13 ENCOUNTER — Telehealth: Payer: Self-pay | Admitting: Nurse Practitioner

## 2022-02-13 ENCOUNTER — Other Ambulatory Visit: Payer: Self-pay | Admitting: Nurse Practitioner

## 2022-02-13 DIAGNOSIS — D571 Sickle-cell disease without crisis: Secondary | ICD-10-CM

## 2022-02-13 MED ORDER — OXYCODONE-ACETAMINOPHEN 10-325 MG PO TABS: 1.0000 | ORAL_TABLET | Freq: Three times a day (TID) | ORAL | 0 refills | Status: DC | PRN

## 2022-02-13 NOTE — Telephone Encounter (Signed)
Oxycoodne refill request  

## 2022-03-13 ENCOUNTER — Other Ambulatory Visit: Payer: Self-pay | Admitting: Nurse Practitioner

## 2022-03-13 ENCOUNTER — Telehealth: Payer: Self-pay | Admitting: Nurse Practitioner

## 2022-03-13 NOTE — Telephone Encounter (Signed)
[facility] [address]

## 2022-03-16 ENCOUNTER — Other Ambulatory Visit: Payer: Self-pay | Admitting: Nurse Practitioner

## 2022-03-16 DIAGNOSIS — D571 Sickle-cell disease without crisis: Secondary | ICD-10-CM

## 2022-03-16 MED ORDER — OXYCODONE-ACETAMINOPHEN 10-325 MG PO TABS: 1.0000 | ORAL_TABLET | Freq: Three times a day (TID) | ORAL | 0 refills | Status: DC | PRN

## 2022-04-01 IMAGING — DX DG FOOT COMPLETE 3+V*L*
3 series · 3 of 3 positions shown · non-contrast
Comparison: None.

CLINICAL DATA: Bilateral plantar foot pain

EXAM:
LEFT FOOT - COMPLETE 3+ VIEW

[foot ap]
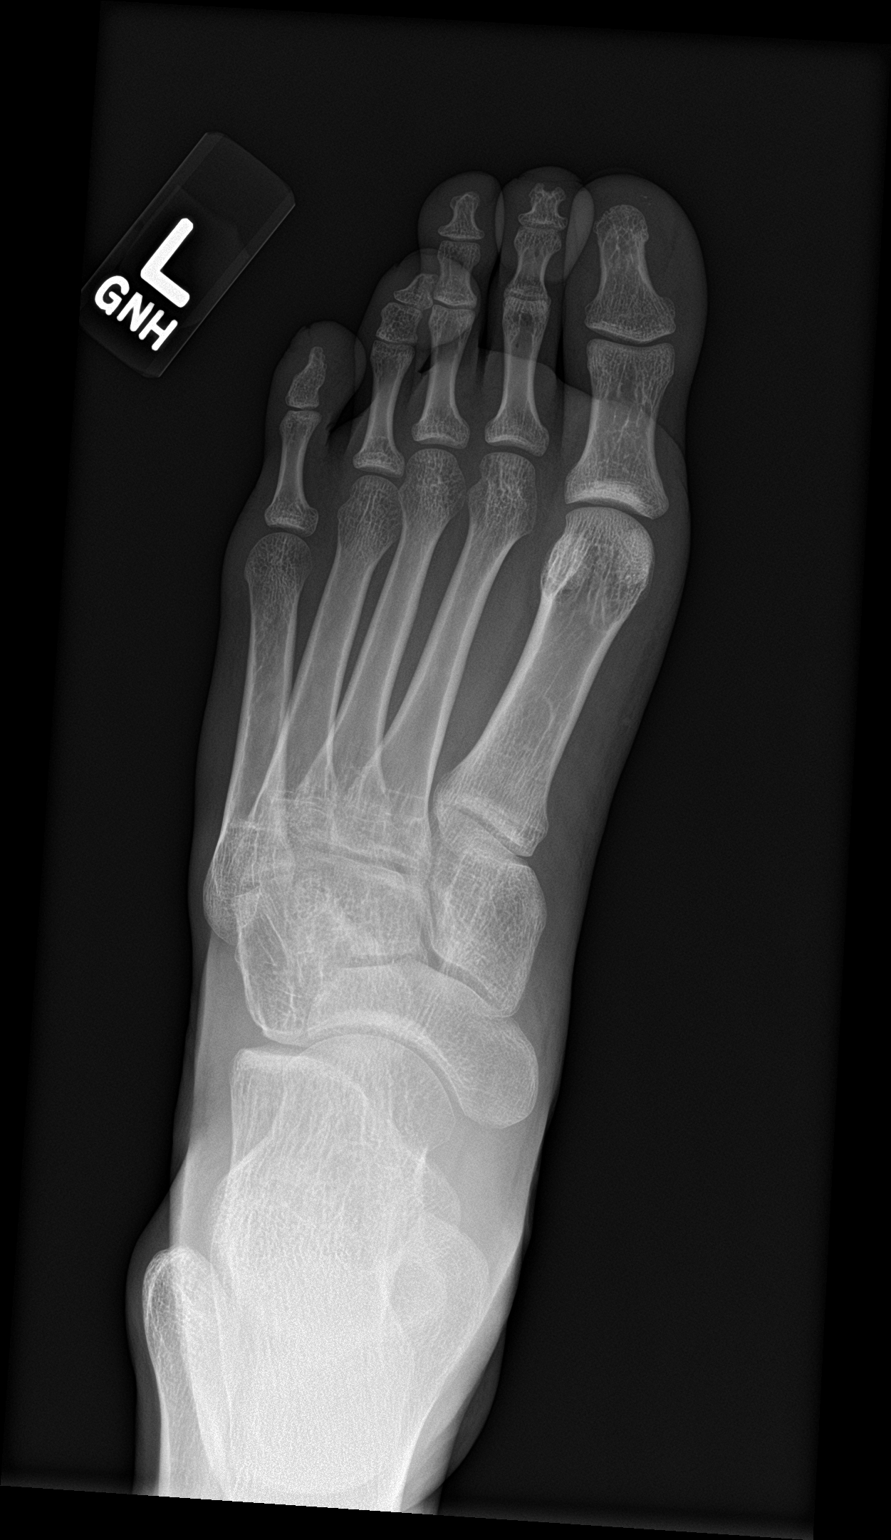

[foot obl]
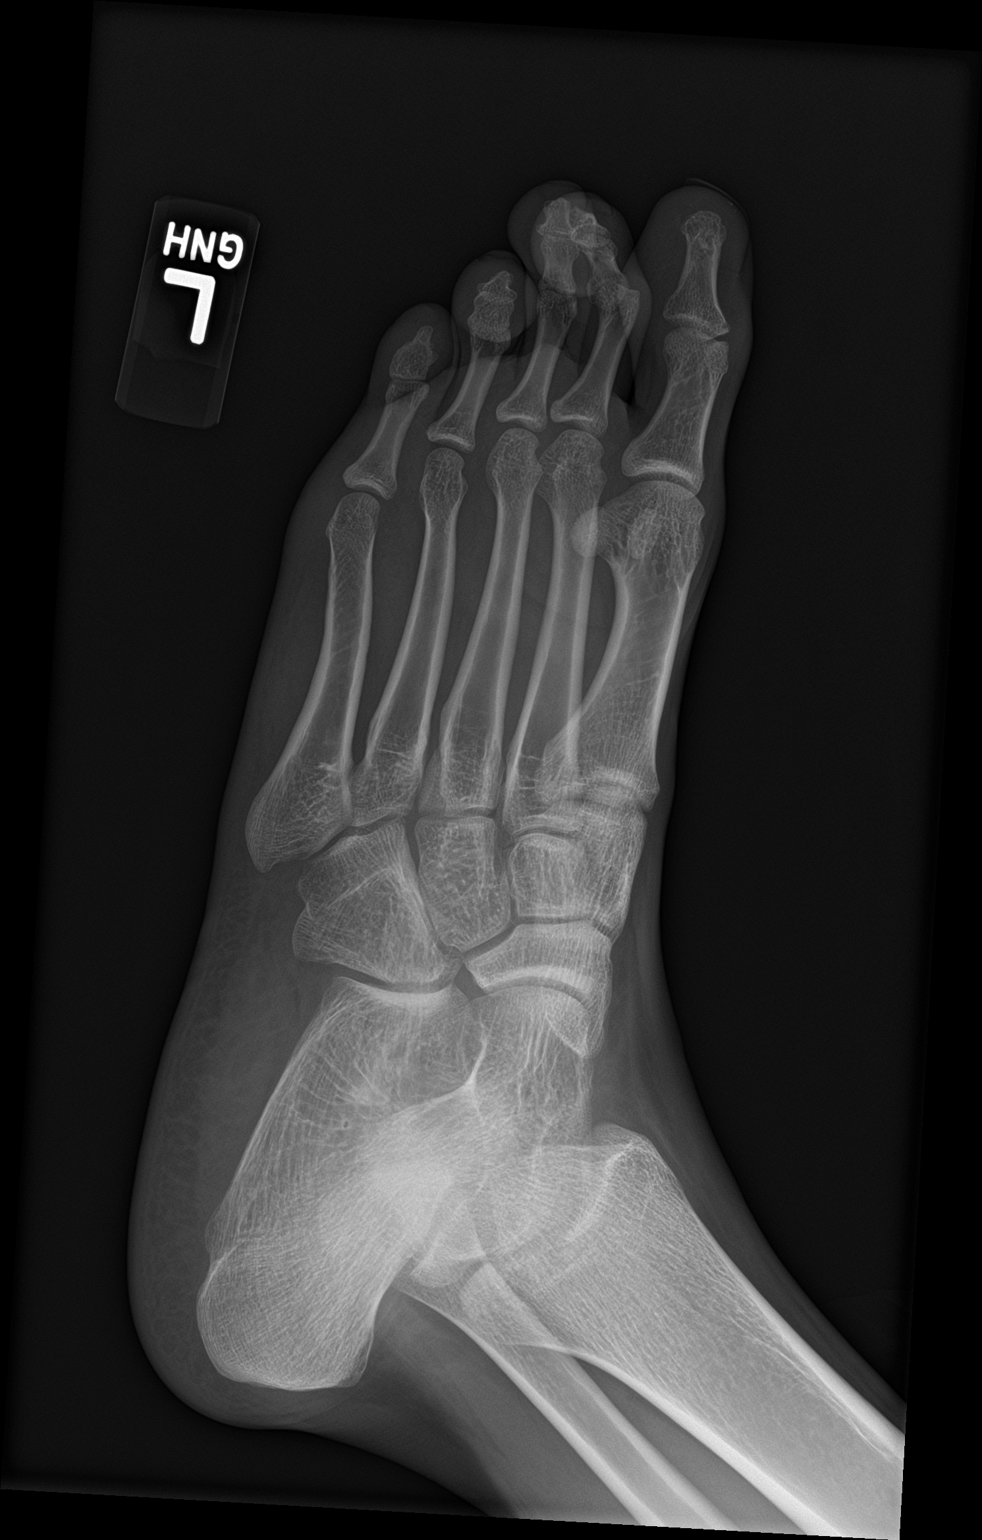

[foot lat]
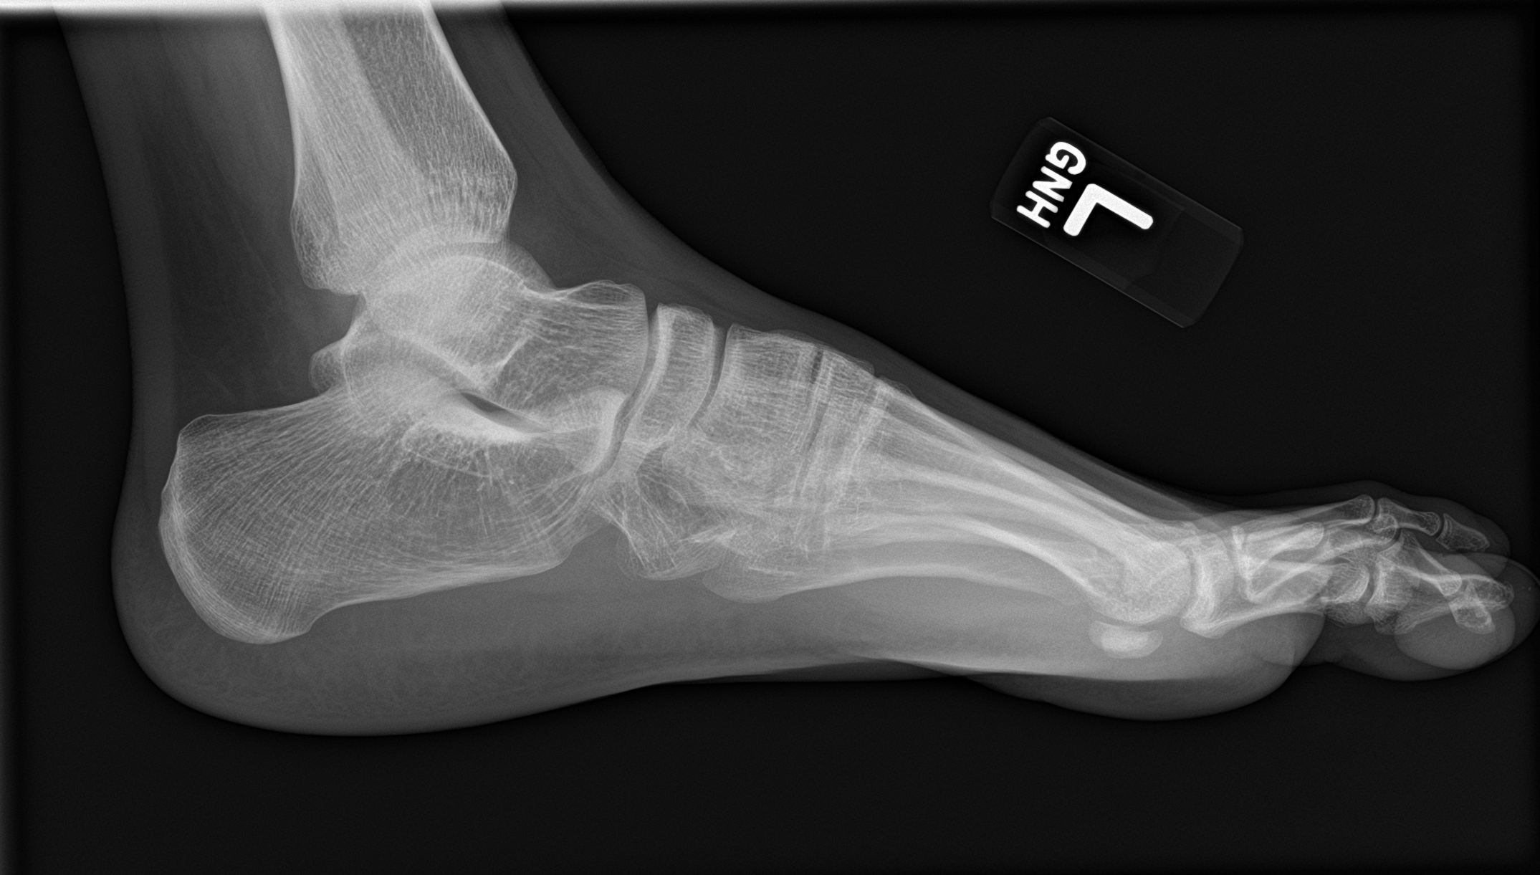

[3 of 3 positions shown; findings below may reference images not displayed]

FINDINGS: There is no evidence of fracture or dislocation. There is no
evidence of arthropathy or other focal bone abnormality. Soft
tissues are unremarkable.
IMPRESSION: Negative.

## 2022-04-02 ENCOUNTER — Other Ambulatory Visit: Payer: Self-pay | Admitting: Nurse Practitioner

## 2022-04-02 DIAGNOSIS — D571 Sickle-cell disease without crisis: Secondary | ICD-10-CM

## 2022-04-02 MED ORDER — OXYCODONE-ACETAMINOPHEN 10-325 MG PO TABS: 1.0000 | ORAL_TABLET | Freq: Three times a day (TID) | ORAL | 0 refills | Status: DC | PRN

## 2022-04-16 ENCOUNTER — Other Ambulatory Visit: Payer: Self-pay | Admitting: Nurse Practitioner

## 2022-04-16 DIAGNOSIS — D571 Sickle-cell disease without crisis: Secondary | ICD-10-CM

## 2022-04-16 MED ORDER — OXYCODONE-ACETAMINOPHEN 10-325 MG PO TABS: 1.0000 | ORAL_TABLET | Freq: Three times a day (TID) | ORAL | 0 refills | Status: DC | PRN

## 2022-04-16 NOTE — Progress Notes (Signed)
PDMP reviewed. Prescription refilled. Will need appointment before any other refills.

## 2022-04-29 ENCOUNTER — Ambulatory Visit (INDEPENDENT_AMBULATORY_CARE_PROVIDER_SITE_OTHER): Payer: Medicaid Other | Admitting: Nurse Practitioner

## 2022-04-29 ENCOUNTER — Encounter: Payer: Self-pay | Admitting: Nurse Practitioner

## 2022-04-29 VITALS — BP 130/62 | HR 72 | Ht 73.0 in | Wt 170.0 lb

## 2022-04-29 DIAGNOSIS — D571 Sickle-cell disease without crisis: Secondary | ICD-10-CM | POA: Diagnosis not present

## 2022-04-29 DIAGNOSIS — G894 Chronic pain syndrome: Secondary | ICD-10-CM

## 2022-04-29 DIAGNOSIS — E559 Vitamin D deficiency, unspecified: Secondary | ICD-10-CM

## 2022-04-29 DIAGNOSIS — Z113 Encounter for screening for infections with a predominantly sexual mode of transmission: Secondary | ICD-10-CM

## 2022-04-29 MED ORDER — FOLIC ACID 1 MG PO TABS: 1.0000 mg | ORAL_TABLET | Freq: Every day | ORAL | 3 refills | Status: DC

## 2022-04-29 NOTE — Patient Instructions (Addendum)
1. Hb-SS disease without crisis (HCC)  - folic acid (FOLVITE) 1 MG tablet; Take 1 tablet (1 mg total) by mouth daily.  Dispense: 90 tablet; Refill: 3   Follow up:  Follow up in 3 months

## 2022-04-29 NOTE — Progress Notes (Signed)
@Patient  ID: Derrick Wu, male    DOB: 03/31/1996, 26 y.o.   MRN: [id number]  Chief Complaint  Patient presents with   std testing    Pt stated-need std test but no symptoms and medication management.    Referring provider: [id number], NP   HPI  Patient presents today for sickle cell follow-up.  He is requesting STD testing today.  Patient declines any blood work today.  He states last time he had a pain panel was Monday.  Patient is currently in school for dental hygienist and usually takes a pain pill at bedtime.  Patient does aspire to be a Friday. Denies f/c/s, n/v/d, hemoptysis, PND, leg swelling Denies chest pain or edema     Allergies  Allergen Reactions   Mushroom Extract Complex Nausea And Vomiting    Immunization History  Administered Date(s) Administered   Meningococcal Conjugate 02/22/2007, 11/05/2010   Pneumococcal Polysaccharide-23 11/23/2001, 02/22/2007, 01/08/2016   Tdap 01/27/2022    Past Medical History:  Diagnosis Date   Asthma    Headache(784.0)    Pneumonia    Seasonal allergies    Sickle cell anemia (HCC)    Vision abnormalities    Hx: of wears glasses   Vitamin D deficiency 09/2020    Tobacco History: Social History   Tobacco Use  Smoking Status Never  Smokeless Tobacco Never   Counseling given: Not Answered   Outpatient Encounter Medications as of 04/29/2022  Medication Sig   folic acid (FOLVITE) 1 MG tablet TAKE 1 TABLET BY MOUTH EVERY DAY   ibuprofen (ADVIL) 800 MG tablet Take 1 tablet (800 mg total) by mouth every 8 (eight) hours as needed.   Multiple Vitamins-Minerals (ONE DAILY MULTIVIT-MIN ADULT) TABS Take 1 tablet by mouth daily.   oxyCODONE-acetaminophen (PERCOCET) 10-325 MG tablet Take 1 tablet by mouth every 8 (eight) hours as needed for pain. Take 1 tablet by mouth every 8 hours as needed for 15 days.   [DISCONTINUED] fexofenadine-pseudoephedrine (ALLEGRA-D 24) 180-240 MG 24 hr tablet Take 1 tablet by  mouth daily. (Patient not taking: Reported on 01/19/2022)   [DISCONTINUED] tiZANidine (ZANAFLEX) 4 MG tablet Take 1 tablet (4 mg total) by mouth 3 (three) times daily. (Patient not taking: Reported on 02/24/2021)   No facility-administered encounter medications on file as of 04/29/2022.     Review of Systems  Review of Systems  Constitutional: Negative.   HENT: Negative.    Cardiovascular: Negative.   Gastrointestinal: Negative.   Allergic/Immunologic: Negative.   Neurological: Negative.   Psychiatric/Behavioral: Negative.         Physical Exam  BP 130/62   Pulse 72   Ht 6\' 1"  (1.854 m)   Wt 170 lb (77.1 kg)   SpO2 98%   BMI 22.43 kg/m   Wt Readings from Last 5 Encounters:  04/29/22 170 lb (77.1 kg)  01/19/22 168 lb 4 oz (76.3 kg)  10/21/21 171 lb 9.6 oz (77.8 kg)  07/24/21 166 lb 8 oz (75.5 kg)  02/24/21 171 lb 9.6 oz (77.8 kg)     Physical Exam Vitals and nursing note reviewed.  Constitutional:      General: He is not in acute distress.    Appearance: He is well-developed.  Cardiovascular:     Rate and Rhythm: Normal rate and regular rhythm.  Pulmonary:     Effort: Pulmonary effort is normal.     Breath sounds: Normal breath sounds.  Skin:    General: Skin is warm and dry.  Neurological:     Mental Status: He is alert and oriented to person, place, and time.      Lab Results:  CBC    Component Value Date/Time   WBC 9.7 01/19/2022 1125   WBC 7.7 01/04/2017 1448   RBC 4.24 01/19/2022 1125   RBC 3.50 (L) 01/04/2017 1448   RBC 3.50 (L) 01/04/2017 1448   HGB 8.9 (L) 01/19/2022 1125   HCT 30.3 (L) 01/19/2022 1125   PLT 643 (H) 01/19/2022 1125   MCV 72 (L) 01/19/2022 1125   MCH 21.0 (L) 01/19/2022 1125   MCH 22.6 (L) 01/04/2017 1448   MCHC 29.4 (L) 01/19/2022 1125   MCHC 30.2 (L) 01/04/2017 1448   RDW 24.4 (H) 01/19/2022 1125   LYMPHSABS 3.6 (H) 01/19/2022 1125   MONOABS 1,001 (H) 01/04/2017 1448   EOSABS 0.7 (H) 01/19/2022 1125   BASOSABS 0.3  (H) 01/19/2022 1125    BMET    Component Value Date/Time   NA 139 01/19/2022 1125   K 4.8 01/19/2022 1125   CL 102 01/19/2022 1125   CO2 24 01/19/2022 1125   GLUCOSE 98 01/19/2022 1125   GLUCOSE 91 01/04/2017 1448   BUN 6 01/19/2022 1125   CREATININE 0.85 01/19/2022 1125   CREATININE 0.68 01/04/2017 1448   CALCIUM 9.8 01/19/2022 1125   GFRNONAA 133 02/02/2020 1556   GFRNONAA >89 01/04/2017 1448   GFRAA 154 02/02/2020 1556   GFRAA >89 01/04/2017 1448       Assessment & Plan:    1. Hb-SS disease without crisis (HCC)  - folic acid (FOLVITE) 1 MG tablet; Take 1 tablet (1 mg total) by mouth daily.  Dispense: 90 tablet; Refill: 3   Follow up:  Follow up in 3 months   Ivonne Andrew, NP 04/29/2022

## 2022-05-01 ENCOUNTER — Other Ambulatory Visit: Payer: Self-pay | Admitting: Nurse Practitioner

## 2022-05-01 DIAGNOSIS — D571 Sickle-cell disease without crisis: Secondary | ICD-10-CM

## 2022-05-01 MED ORDER — OXYCODONE-ACETAMINOPHEN 10-325 MG PO TABS: 1.0000 | ORAL_TABLET | Freq: Three times a day (TID) | ORAL | 0 refills | Status: DC | PRN

## 2022-05-02 LAB — CHLAMYDIA/GONOCOCCUS/TRICHOMONAS, NAA
Chlamydia by NAA: NEGATIVE
Gonococcus by NAA: NEGATIVE
Trich vag by NAA: NEGATIVE

## 2022-05-04 LAB — DRUG SCREEN 764883 11+OXYCO+ALC+CRT-BUND
Amphetamines, Urine: NEGATIVE ng/mL
BENZODIAZ UR QL: NEGATIVE ng/mL
Barbiturate: NEGATIVE ng/mL
Cocaine (Metabolite): NEGATIVE ng/mL
Creatinine: 112.1 mg/dL (ref 20.0–300.0)
Ethanol: NEGATIVE %
Meperidine: NEGATIVE ng/mL
Methadone Screen, Urine: NEGATIVE ng/mL
OPIATE SCREEN URINE: NEGATIVE ng/mL
Oxycodone/Oxymorphone, Urine: NEGATIVE ng/mL
Phencyclidine: NEGATIVE ng/mL
Propoxyphene: NEGATIVE ng/mL
Tramadol: NEGATIVE ng/mL
pH, Urine: 6.4 (ref 4.5–8.9)

## 2022-05-04 LAB — CANNABINOID CONFIRMATION, UR
CANNABINOIDS: POSITIVE — AB
Carboxy THC GC/MS Conf: 64 ng/mL

## 2022-05-05 ENCOUNTER — Encounter: Payer: Self-pay | Admitting: *Deleted

## 2022-05-15 ENCOUNTER — Other Ambulatory Visit: Payer: Self-pay | Admitting: Nurse Practitioner

## 2022-05-15 DIAGNOSIS — D571 Sickle-cell disease without crisis: Secondary | ICD-10-CM

## 2022-05-15 MED ORDER — OXYCODONE-ACETAMINOPHEN 10-325 MG PO TABS: 1.0000 | ORAL_TABLET | Freq: Three times a day (TID) | ORAL | 0 refills | Status: DC | PRN

## 2022-05-28 ENCOUNTER — Other Ambulatory Visit: Payer: Self-pay | Admitting: Nurse Practitioner

## 2022-05-28 DIAGNOSIS — D571 Sickle-cell disease without crisis: Secondary | ICD-10-CM

## 2022-05-28 MED ORDER — OXYCODONE-ACETAMINOPHEN 10-325 MG PO TABS: 1.0000 | ORAL_TABLET | Freq: Three times a day (TID) | ORAL | 0 refills | Status: DC | PRN

## 2022-06-12 ENCOUNTER — Other Ambulatory Visit: Payer: Self-pay | Admitting: Nurse Practitioner

## 2022-06-12 DIAGNOSIS — D571 Sickle-cell disease without crisis: Secondary | ICD-10-CM

## 2022-06-12 MED ORDER — OXYCODONE-ACETAMINOPHEN 10-325 MG PO TABS: 1.0000 | ORAL_TABLET | Freq: Three times a day (TID) | ORAL | 0 refills | Status: DC | PRN

## 2022-06-25 ENCOUNTER — Other Ambulatory Visit: Payer: Self-pay

## 2022-06-25 DIAGNOSIS — D571 Sickle-cell disease without crisis: Secondary | ICD-10-CM

## 2022-06-25 NOTE — Telephone Encounter (Signed)
From: Becky E Stabenow To: Office of Fenton Foy, NP Sent: 06/25/2022 7:09 AM EST Subject: Medication Renewal Request  Refills have been requested for the following medications:   oxyCODONE-acetaminophen (PERCOCET) 10-325 MG tablet Derrick Wu]  [id number] [name] Delivery method: [employer]

## 2022-06-26 MED ORDER — OXYCODONE-ACETAMINOPHEN 10-325 MG PO TABS: 1.0000 | ORAL_TABLET | Freq: Three times a day (TID) | ORAL | 0 refills | Status: DC | PRN

## 2022-07-09 ENCOUNTER — Other Ambulatory Visit: Payer: Self-pay

## 2022-07-09 DIAGNOSIS — D571 Sickle-cell disease without crisis: Secondary | ICD-10-CM

## 2022-07-09 NOTE — Telephone Encounter (Signed)
From: Jashun E Marzette To: Office of Fenton Foy, NP Sent: 07/09/2022 10:08 AM EST Subject: Medication Renewal Request  Refills have been requested for the following medications:   oxyCODONE-acetaminophen (PERCOCET) 10-325 MG tablet Kriste Basque Nichols]  [facility]erred [id number] [name] Delivery method: [employer]

## 2022-07-10 MED ORDER — OXYCODONE-ACETAMINOPHEN 10-325 MG PO TABS: 1.0000 | ORAL_TABLET | Freq: Three times a day (TID) | ORAL | 0 refills | Status: DC | PRN

## 2022-07-24 ENCOUNTER — Other Ambulatory Visit: Payer: Self-pay

## 2022-07-24 DIAGNOSIS — D571 Sickle-cell disease without crisis: Secondary | ICD-10-CM

## 2022-07-24 MED ORDER — OXYCODONE-ACETAMINOPHEN 10-325 MG PO TABS: 1.0000 | ORAL_TABLET | Freq: Three times a day (TID) | ORAL | 0 refills | Status: DC | PRN

## 2022-07-24 NOTE — Telephone Encounter (Signed)
Please advise KH 

## 2022-07-24 NOTE — Telephone Encounter (Signed)
From: Dietrich E Tetterton To: Office of Fenton Foy, NP Sent: 07/23/2022 2:12 PM EST Subject: Medication Renewal Request  Refills have been requested for the following medications:   oxyCODONE-acetaminophen (PERCOCET) 10-325 MG tablet Kriste Basque Nichols]  [name] Delivery method: [employer]

## 2022-07-27 ENCOUNTER — Ambulatory Visit: Payer: Self-pay | Admitting: Nurse Practitioner

## 2022-08-06 ENCOUNTER — Other Ambulatory Visit: Payer: Self-pay

## 2022-08-06 DIAGNOSIS — D571 Sickle-cell disease without crisis: Secondary | ICD-10-CM

## 2022-08-06 NOTE — Telephone Encounter (Signed)
From: Lafe E Brazell To: Office of Fenton Foy, NP Sent: 08/06/2022 8:09 AM EST Subject: Medication Renewal Request  Refills have been requested for the following medications:   oxyCODONE-acetaminophen (PERCOCET) 10-325 MG tablet Derrick Wu]  [name] Delivery method: [employer]

## 2022-08-07 MED ORDER — OXYCODONE-ACETAMINOPHEN 10-325 MG PO TABS: 1.0000 | ORAL_TABLET | Freq: Three times a day (TID) | ORAL | 0 refills | Status: DC | PRN

## 2022-08-12 ENCOUNTER — Ambulatory Visit: Payer: Medicaid Other | Admitting: Nurse Practitioner

## 2022-08-12 ENCOUNTER — Encounter: Payer: Self-pay | Admitting: Nurse Practitioner

## 2022-08-12 VITALS — BP 122/48 | HR 80 | Temp 98.0°F | Ht 73.0 in | Wt 173.0 lb

## 2022-08-12 DIAGNOSIS — E559 Vitamin D deficiency, unspecified: Secondary | ICD-10-CM | POA: Diagnosis not present

## 2022-08-12 DIAGNOSIS — R079 Chest pain, unspecified: Secondary | ICD-10-CM | POA: Diagnosis not present

## 2022-08-12 DIAGNOSIS — D571 Sickle-cell disease without crisis: Secondary | ICD-10-CM

## 2022-08-12 MED ORDER — ONE DAILY MULTIVIT-MIN ADULT PO TABS: 1.0000 | ORAL_TABLET | Freq: Every day | ORAL | 1 refills | Status: AC

## 2022-08-12 MED ORDER — FOLIC ACID 1 MG PO TABS: 1.0000 mg | ORAL_TABLET | Freq: Every day | ORAL | 3 refills | Status: DC

## 2022-08-12 NOTE — Progress Notes (Signed)
$'@Patient'k$  ID: Derrick Wu, male    DOB: 11-Jan-1996, 27 y.o.   MRN: [id number]  Chief Complaint  Patient presents with   Sickle Cell Anemia    Referring provider: Fenton Foy, NP   HPI  Patient presents today for sickle cell follow-up.  He is requesting STD testing today.  Patient declines any blood work today.  He states last time he had a pain panel was Monday.  Patient is currently in school for dental hygienist and usually takes a pain pill at bedtime.  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 Denies f/c/s, n/v/d, hemoptysis, PND, leg swelling. Denies chest pain or edema.     Allergies  Allergen Reactions   Mushroom Extract Complex Nausea And Vomiting    Immunization History  Administered Date(s) Administered   Meningococcal Conjugate 02/22/2007, 11/05/2010   Pneumococcal Polysaccharide-23 11/23/2001, 02/22/2007, 01/08/2016   Tdap 01/27/2022    Past Medical History:  Diagnosis Date   Asthma    Headache(784.0)    Pneumonia    Seasonal allergies    Sickle cell anemia ([place])    Vision abnormalities    Hx: of wears glasses   Vitamin D deficiency 09/2020    Tobacco History: Social History   Tobacco Use  Smoking Status Never  Smokeless Tobacco Never   Counseling given: Not Answered   Outpatient Encounter Medications as of 08/12/2022  Medication Sig   oxyCODONE-acetaminophen (PERCOCET) 10-325 MG tablet Take 1 tablet by mouth every 8 (eight) hours as needed for pain. Take 1 tablet by mouth every 8 hours as needed for 15 days.   [DISCONTINUED] folic acid (FOLVITE) 1 MG tablet Take 1 tablet (1 mg total) by mouth daily.   [DISCONTINUED] Multiple Vitamins-Minerals (ONE DAILY MULTIVIT-MIN ADULT) TABS Take 1 tablet by mouth daily.   folic acid (FOLVITE) 1 MG tablet Take 1 tablet (1 mg total) by mouth daily.   ibuprofen (ADVIL) 800 MG tablet Take 1 tablet (800 mg total) by mouth every 8 (eight) hours as needed. (Patient not taking: Reported on 08/12/2022)    Multiple Vitamins-Minerals (ONE DAILY MULTIVIT-MIN ADULT) TABS Take 1 tablet by mouth daily.   No facility-administered encounter medications on file as of 08/12/2022.     Review of Systems  Review of Systems  Constitutional: Negative.   HENT: Negative.    Cardiovascular: Negative.   Gastrointestinal: Negative.   Allergic/Immunologic: Negative.   Neurological: Negative.   Psychiatric/Behavioral: Negative.         Physical Exam  BP (!) 122/48   Pulse 80   Temp 98 F (36.7 C)   Ht '6\' 1"'$  (1.854 m)   Wt 173 lb (78.5 kg)   SpO2 100%   BMI 22.82 kg/m   Wt Readings from Last 5 Encounters:  08/12/22 173 lb (78.5 kg)  04/29/22 170 lb (77.1 kg)  01/19/22 168 lb 4 oz (76.3 kg)  10/21/21 171 lb 9.6 oz (77.8 kg)  07/24/21 166 lb 8 oz (75.5 kg)     Physical Exam Vitals and nursing note reviewed.  Constitutional:      General: He is not in acute distress.    Appearance: He is well-developed.  Cardiovascular:     Rate and Rhythm: Normal rate and regular rhythm.  Pulmonary:     Effort: Pulmonary effort is normal.     Breath sounds: Normal breath sounds.  Skin:    General: Skin is warm and dry.  Neurological:     Mental Status: He is alert and oriented  to person, place, and time.      Lab Results:  CBC    Component Value Date/Time   WBC 9.1 08/12/2022 1438   WBC 7.7 01/04/2017 1448   RBC 3.77 (L) 08/12/2022 1438   RBC 3.50 (L) 01/04/2017 1448   RBC 3.50 (L) 01/04/2017 1448   HGB 8.4 (L) 08/12/2022 1438   HCT 28.3 (L) 08/12/2022 1438   PLT 570 (H) 08/12/2022 1438   MCV 75 (L) 08/12/2022 1438   MCH 22.3 (L) 08/12/2022 1438   MCH 22.6 (L) 01/04/2017 1448   MCHC 29.7 (L) 08/12/2022 1438   MCHC 30.2 (L) 01/04/2017 1448   RDW 23.7 (H) 08/12/2022 1438   LYMPHSABS 3.4 (H) 08/12/2022 1438   MONOABS 1,001 (H) 01/04/2017 1448   EOSABS 0.4 08/12/2022 1438   BASOSABS 0.1 08/12/2022 1438    BMET    Component Value Date/Time   NA 140 08/12/2022 1438   K 3.8  08/12/2022 1438   CL 104 08/12/2022 1438   CO2 21 08/12/2022 1438   GLUCOSE 103 (H) 08/12/2022 1438   GLUCOSE 91 01/04/2017 1448   BUN 4 (L) 08/12/2022 1438   CREATININE 0.78 08/12/2022 1438   CREATININE 0.68 01/04/2017 1448   CALCIUM 9.3 08/12/2022 1438   GFRNONAA 133 02/02/2020 1556   GFRNONAA >89 01/04/2017 1448   GFRAA 154 02/02/2020 1556   GFRAA >89 01/04/2017 1448     Assessment & Plan:   Intermittent chest pain - EKG 12-Lead   2. 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 Hb-SS disease without crisis ([place])  - Sickle Cell Panel - Drug Screen 11 w/Conf, Ser   Follow up:  Follow up in 3 months     Fenton Foy, NP 08/13/2022

## 2022-08-12 NOTE — Patient Instructions (Addendum)
1. Intermittent chest pain  - EKG 12-Lead   2. 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 Hb-SS disease without crisis ([place])  - Sickle Cell Panel - Drug Screen 11 w/Conf, Ser   Follow up:  Follow up in 3 months

## 2022-08-13 ENCOUNTER — Encounter: Payer: Self-pay | Admitting: Nurse Practitioner

## 2022-08-13 DIAGNOSIS — R079 Chest pain, unspecified: Secondary | ICD-10-CM | POA: Insufficient documentation

## 2022-08-13 NOTE — Assessment & Plan Note (Signed)
-   EKG 12-Lead   2. 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 Hb-SS disease without crisis ([place])  - Sickle Cell Panel - Drug Screen 11 w/Conf, Ser   Follow up:  Follow up in 3 months

## 2022-08-18 LAB — THC,MS,WB/SP RFX

## 2022-08-18 LAB — CMP14+CBC/D/PLT+FER+RETIC+V...
ALT: 33 IU/L (ref 0–44)
AST: 35 IU/L (ref 0–40)
Albumin/Globulin Ratio: 1.4 (ref 1.2–2.2)
Albumin: 4.6 g/dL (ref 4.3–5.2)
Alkaline Phosphatase: 101 IU/L (ref 44–121)
BUN/Creatinine Ratio: 5 — ABNORMAL LOW (ref 9–20)
BUN: 4 mg/dL — ABNORMAL LOW (ref 6–20)
Basophils Absolute: 0.1 10*3/uL (ref 0.0–0.2)
Basos: 1 %
Bilirubin Total: 0.7 mg/dL (ref 0.0–1.2)
CO2: 21 mmol/L (ref 20–29)
Calcium: 9.3 mg/dL (ref 8.7–10.2)
Chloride: 104 mmol/L (ref 96–106)
Creatinine, Ser: 0.78 mg/dL (ref 0.76–1.27)
EOS (ABSOLUTE): 0.4 10*3/uL (ref 0.0–0.4)
Eos: 4 %
Ferritin: 27 ng/mL — ABNORMAL LOW (ref 30–400)
Globulin, Total: 3.2 g/dL (ref 1.5–4.5)
Glucose: 103 mg/dL — ABNORMAL HIGH (ref 70–99)
Hematocrit: 28.3 % — ABNORMAL LOW (ref 37.5–51.0)
Hemoglobin: 8.4 g/dL — ABNORMAL LOW (ref 13.0–17.7)
Immature Grans (Abs): 0 10*3/uL (ref 0.0–0.1)
Immature Granulocytes: 0 %
Lymphocytes Absolute: 3.4 10*3/uL — ABNORMAL HIGH (ref 0.7–3.1)
Lymphs: 38 %
MCH: 22.3 pg — ABNORMAL LOW (ref 26.6–33.0)
MCHC: 29.7 g/dL — ABNORMAL LOW (ref 31.5–35.7)
MCV: 75 fL — ABNORMAL LOW (ref 79–97)
Monocytes Absolute: 0.9 10*3/uL (ref 0.1–0.9)
Monocytes: 10 %
NRBC: 1 % — ABNORMAL HIGH (ref 0–0)
Neutrophils Absolute: 4.2 10*3/uL (ref 1.4–7.0)
Neutrophils: 47 %
Platelets: 570 10*3/uL — ABNORMAL HIGH (ref 150–450)
Potassium: 3.8 mmol/L (ref 3.5–5.2)
RBC: 3.77 x10E6/uL — ABNORMAL LOW (ref 4.14–5.80)
RDW: 23.7 % — ABNORMAL HIGH (ref 11.6–15.4)
Retic Ct Pct: 6.1 % — ABNORMAL HIGH (ref 0.6–2.6)
Sodium: 140 mmol/L (ref 134–144)
Total Protein: 7.8 g/dL (ref 6.0–8.5)
Vit D, 25-Hydroxy: 17.8 ng/mL — ABNORMAL LOW (ref 30.0–100.0)
WBC: 9.1 10*3/uL (ref 3.4–10.8)
eGFR: 127 mL/min/{1.73_m2} (ref >59)

## 2022-08-18 LAB — DRUG SCREEN 11 W/CONF, SE
Amphetamines, IA: NEGATIVE ng/mL
Barbiturates, IA: NEGATIVE ug/mL
Benzodiazepines, IA: NEGATIVE ng/mL
Cocaine & Metabolite, IA: NEGATIVE ng/mL
Ethyl Alcohol, Enz: NEGATIVE gm/dL
Methadone, IA: NEGATIVE ng/mL
Opiates, IA: NEGATIVE ng/mL
Oxycodones, IA: NEGATIVE ng/mL
Phencyclidine, IA: NEGATIVE ng/mL
Propoxyphene, IA: NEGATIVE ng/mL

## 2022-08-20 ENCOUNTER — Other Ambulatory Visit: Payer: Self-pay | Admitting: Nurse Practitioner

## 2022-08-20 ENCOUNTER — Other Ambulatory Visit: Payer: Self-pay

## 2022-08-20 DIAGNOSIS — E559 Vitamin D deficiency, unspecified: Secondary | ICD-10-CM

## 2022-08-20 DIAGNOSIS — D571 Sickle-cell disease without crisis: Secondary | ICD-10-CM

## 2022-08-20 MED ORDER — VITAMIN D (ERGOCALCIFEROL) 1.25 MG (50000 UNIT) PO CAPS: 50000.0000 [IU] | ORAL_CAPSULE | ORAL | 5 refills | Status: DC

## 2022-08-20 NOTE — Telephone Encounter (Signed)
From: Cyree E Belgard To: Office of Fenton Foy, NP Sent: 08/20/2022 11:50 AM EST Subject: Medication Renewal Request  Refills have been requested for the following medications:   oxyCODONE-acetaminophen (PERCOCET) 10-325 MG tablet Kriste Basque Nichols]  [name] Delivery method: [employer]

## 2022-08-25 NOTE — Telephone Encounter (Signed)
Caller & Relationship to patient:  MRN #  [id number]   Call Back Number:   Date of Last Office Visit: 08/20/2022     Date of Next Office Visit: 11/13/2022    Medication(s) to be Refilled: allergy med and OXY  Preferred Pharmacy:   ** Please notify patient to allow 48-72 hours to process** **Let patient know to contact pharmacy at the end of the day to make sure medication is ready. ** **If patient has not been seen in a year or longer, book an appointment **Advise to use MyChart for refill requests OR to contact their pharmacy

## 2022-08-26 ENCOUNTER — Other Ambulatory Visit: Payer: Self-pay

## 2022-08-26 DIAGNOSIS — D571 Sickle-cell disease without crisis: Secondary | ICD-10-CM

## 2022-08-26 NOTE — Telephone Encounter (Signed)
Please advise KH 

## 2022-08-26 NOTE — Telephone Encounter (Signed)
From: Mackson E Stewart To: Office of Fenton Foy, NP Sent: 08/21/2022 1:11 PM EST Subject: Medication Renewal Request  Refills have been requested for the following medications:   oxyCODONE-acetaminophen (PERCOCET) 10-325 MG tablet Kriste Basque Nichols]  [name] Delivery method: [employer]

## 2022-08-27 ENCOUNTER — Other Ambulatory Visit: Payer: Self-pay | Admitting: Nurse Practitioner

## 2022-08-27 DIAGNOSIS — G894 Chronic pain syndrome: Secondary | ICD-10-CM

## 2022-08-27 DIAGNOSIS — D571 Sickle-cell disease without crisis: Secondary | ICD-10-CM

## 2022-08-27 MED ORDER — OXYCODONE-ACETAMINOPHEN 10-325 MG PO TABS: 1.0000 | ORAL_TABLET | Freq: Three times a day (TID) | ORAL | 0 refills | Status: DC | PRN

## 2022-08-27 MED ORDER — CETIRIZINE HCL 10 MG PO TABS: 10.0000 mg | ORAL_TABLET | Freq: Every day | ORAL | 2 refills | Status: DC

## 2022-08-27 MED ORDER — IBUPROFEN 800 MG PO TABS: 800.0000 mg | ORAL_TABLET | Freq: Three times a day (TID) | ORAL | 6 refills | Status: DC | PRN

## 2022-09-15 ENCOUNTER — Other Ambulatory Visit: Payer: Self-pay

## 2022-09-15 DIAGNOSIS — D571 Sickle-cell disease without crisis: Secondary | ICD-10-CM

## 2022-09-15 NOTE — Telephone Encounter (Signed)
From: Willaim E Wong To: Office of Fenton Foy, NP Sent: 09/14/2022 5:41 PM EDT Subject: Medication Renewal Request  Refills have been requested for the following medications:   oxyCODONE-acetaminophen (PERCOCET) 10-325 MG tablet Kriste Basque Nichols]  Preferred pharmacy: [id number] [name], [place] - [address] Delivery method: [employer]

## 2022-09-15 NOTE — Telephone Encounter (Signed)
Please advise KH 

## 2022-09-16 ENCOUNTER — Other Ambulatory Visit: Payer: Self-pay | Admitting: Nurse Practitioner

## 2022-09-16 MED ORDER — OXYCODONE-ACETAMINOPHEN 10-325 MG PO TABS
1.0000 | ORAL_TABLET | Freq: Three times a day (TID) | ORAL | 0 refills | Status: DC | PRN
Start: 2022-09-16 — End: 2022-09-30

## 2022-09-30 ENCOUNTER — Other Ambulatory Visit: Payer: Self-pay

## 2022-09-30 DIAGNOSIS — D571 Sickle-cell disease without crisis: Secondary | ICD-10-CM

## 2022-09-30 MED ORDER — OXYCODONE-ACETAMINOPHEN 10-325 MG PO TABS
1.0000 | ORAL_TABLET | Freq: Three times a day (TID) | ORAL | 0 refills | Status: DC | PRN
Start: 2022-09-30 — End: 2022-10-14

## 2022-09-30 NOTE — Telephone Encounter (Signed)
From: Rudra E Wyka To: Office of Ivonne Andrew, NP Sent: 09/30/2022 9:08 AM EDT Subject: Medication Renewal Request  Refills have been requested for the following medications:   oxyCODONE-acetaminophen (PERCOCET) 10-325 MG tablet Derrick Wu]  Preferred pharmacy: [id number] [address], [place] - [address] Delivery method: [employer]

## 2022-09-30 NOTE — Telephone Encounter (Signed)
Please advise KH 

## 2022-10-14 ENCOUNTER — Other Ambulatory Visit: Payer: Self-pay | Admitting: Nurse Practitioner

## 2022-10-14 DIAGNOSIS — D571 Sickle-cell disease without crisis: Secondary | ICD-10-CM

## 2022-10-14 MED ORDER — OXYCODONE-ACETAMINOPHEN 10-325 MG PO TABS
1.0000 | ORAL_TABLET | Freq: Three times a day (TID) | ORAL | 0 refills | Status: DC | PRN
Start: 2022-10-14 — End: 2022-10-28

## 2022-10-14 NOTE — Progress Notes (Signed)
PDMP reviewed. Percocet refilled for sickle cell pain.

## 2022-10-28 ENCOUNTER — Other Ambulatory Visit: Payer: Self-pay | Admitting: Nurse Practitioner

## 2022-10-28 DIAGNOSIS — D571 Sickle-cell disease without crisis: Secondary | ICD-10-CM

## 2022-10-28 MED ORDER — OXYCODONE-ACETAMINOPHEN 10-325 MG PO TABS
1.0000 | ORAL_TABLET | Freq: Three times a day (TID) | ORAL | 0 refills | Status: DC | PRN
Start: 2022-10-28 — End: 2022-11-12

## 2022-11-12 ENCOUNTER — Other Ambulatory Visit: Payer: Self-pay

## 2022-11-12 DIAGNOSIS — D571 Sickle-cell disease without crisis: Secondary | ICD-10-CM

## 2022-11-12 MED ORDER — OXYCODONE-ACETAMINOPHEN 10-325 MG PO TABS
1.0000 | ORAL_TABLET | Freq: Three times a day (TID) | ORAL | 0 refills | Status: DC | PRN
Start: 2022-11-12 — End: 2022-11-27

## 2022-11-12 NOTE — Telephone Encounter (Signed)
Please advise KH 

## 2022-11-12 NOTE — Telephone Encounter (Signed)
From: Caz E Joubert To: Office of Ivonne Andrew, NP Sent: 11/12/2022 8:24 AM EDT Subject: Medication Renewal Request  Refills have been requested for the following medications:   oxyCODONE-acetaminophen (PERCOCET) 10-325 MG tablet Gildardo Pounds Nichols]  Preferred pharmacy: [id number] [address] [address], [place] - [address] Delivery method: [employer]

## 2022-11-13 ENCOUNTER — Ambulatory Visit: Payer: Self-pay | Admitting: Nurse Practitioner

## 2022-11-19 ENCOUNTER — Encounter: Payer: Self-pay | Admitting: Nurse Practitioner

## 2022-11-19 ENCOUNTER — Ambulatory Visit (INDEPENDENT_AMBULATORY_CARE_PROVIDER_SITE_OTHER): Payer: Medicaid Other | Admitting: Nurse Practitioner

## 2022-11-19 VITALS — BP 128/54 | HR 78 | Ht 73.0 in | Wt 168.0 lb

## 2022-11-19 DIAGNOSIS — D571 Sickle-cell disease without crisis: Secondary | ICD-10-CM

## 2022-11-19 DIAGNOSIS — G894 Chronic pain syndrome: Secondary | ICD-10-CM

## 2022-11-19 DIAGNOSIS — Z113 Encounter for screening for infections with a predominantly sexual mode of transmission: Secondary | ICD-10-CM | POA: Diagnosis not present

## 2022-11-19 DIAGNOSIS — Z13220 Encounter for screening for lipoid disorders: Secondary | ICD-10-CM | POA: Diagnosis not present

## 2022-11-19 NOTE — Patient Instructions (Signed)
1. Hb-SS disease without crisis (HCC)  - Drug Screen 11 w/Conf, Ser - Sickle Cell Panel  2. Chronic pain syndrome  - Drug Screen 11 w/Conf, Ser  3. Lipid screening  - Lipid Panel  4. Screen for STD (sexually transmitted disease)  - Chlamydia/Gonococcus/Trichomonas, NAA - RPR+HIV+GC+CT Panel  Follow up:  Follow up in 3 months

## 2022-11-19 NOTE — Assessment & Plan Note (Signed)
-   Drug Screen 11 w/Conf, Ser - Sickle Cell Panel  2. Chronic pain syndrome  - Drug Screen 11 w/Conf, Ser  3. Lipid screening  - Lipid Panel  4. Screen for STD (sexually transmitted disease)  - Chlamydia/Gonococcus/Trichomonas, NAA - RPR+HIV+GC+CT Panel  Follow up:  Follow up in 3 months

## 2022-11-19 NOTE — Progress Notes (Signed)
@Patient  ID: Derrick Wu, male    DOB: Jul 30, 1995, 27 y.o.   MRN: [id number]  Chief Complaint  Patient presents with   Follow-up    Referring provider: Ivonne Andrew, NP   HPI  Patient presents today for sickle cell follow-up.  He is requesting STD testing today. 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 Denies f/c/s, n/v/d, hemoptysis, PND, leg swelling. Denies chest pain or edema.     Allergies  Allergen Reactions   Mushroom Extract Complex Nausea And Vomiting    Immunization History  Administered Date(s) Administered   Meningococcal Conjugate 02/22/2007, 11/05/2010   Pneumococcal Polysaccharide-23 11/23/2001, 02/22/2007, 01/08/2016   Tdap 01/27/2022    Past Medical History:  Diagnosis Date   Asthma    Headache(784.0)    Pneumonia    Seasonal allergies    Sickle cell anemia (HCC)    Vision abnormalities    Hx: of wears glasses   Vitamin D deficiency 09/2020    Tobacco History: Social History   Tobacco Use  Smoking Status Never  Smokeless Tobacco Never   Counseling given: Not Answered   Outpatient Encounter Medications as of 11/19/2022  Medication Sig   folic acid (FOLVITE) 1 MG tablet Take 1 tablet (1 mg total) by mouth daily.   oxyCODONE-acetaminophen (PERCOCET) 10-325 MG tablet Take 1 tablet by mouth every 8 (eight) hours as needed for pain. Take 1 tablet by mouth every 8 hours as needed for 15 days.   Vitamin D, Ergocalciferol, (DRISDOL) 1.25 MG (50000 UNIT) CAPS capsule Take 1 capsule (50,000 Units total) by mouth every 7 (seven) days. (Patient not taking: Reported on 11/19/2022)   cetirizine (ZYRTEC) 10 MG tablet TAKE 1 TABLET BY MOUTH EVERY DAY (Patient not taking: Reported on 11/19/2022)   ibuprofen (ADVIL) 800 MG tablet Take 1 tablet (800 mg total) by mouth every 8 (eight) hours as needed. (Patient not taking: Reported on 11/19/2022)   Multiple Vitamins-Minerals (ONE DAILY MULTIVIT-MIN ADULT) TABS  Take 1 tablet by mouth daily. (Patient not taking: Reported on 11/19/2022)   No facility-administered encounter medications on file as of 11/19/2022.     Review of Systems  Review of Systems  Constitutional: Negative.   HENT: Negative.    Cardiovascular: Negative.   Gastrointestinal: Negative.   Allergic/Immunologic: Negative.   Neurological: Negative.   Psychiatric/Behavioral: Negative.         Physical Exam  BP (!) 128/54   Pulse 78   Ht 6\' 1"  (1.854 m)   Wt 168 lb (76.2 kg)   SpO2 98%   BMI 22.16 kg/m   Wt Readings from Last 5 Encounters:  11/19/22 168 lb (76.2 kg)  08/12/22 173 lb (78.5 kg)  04/29/22 170 lb (77.1 kg)  01/19/22 168 lb 4 oz (76.3 kg)  10/21/21 171 lb 9.6 oz (77.8 kg)     Physical Exam Vitals and nursing note reviewed.  Constitutional:      General: He is not in acute distress.    Appearance: He is well-developed.  Cardiovascular:     Rate and Rhythm: Normal rate and regular rhythm.  Pulmonary:     Effort: Pulmonary effort is normal.     Breath sounds: Normal breath sounds.  Skin:    General: Skin is warm and dry.  Neurological:     Mental Status: He is alert and oriented to person, place, and time.      Lab Results:  CBC    Component Value  Date/Time   WBC 9.1 08/12/2022 1438   WBC 7.7 01/04/2017 1448   RBC 3.77 (L) 08/12/2022 1438   RBC 3.50 (L) 01/04/2017 1448   RBC 3.50 (L) 01/04/2017 1448   HGB 8.4 (L) 08/12/2022 1438   HCT 28.3 (L) 08/12/2022 1438   PLT 570 (H) 08/12/2022 1438   MCV 75 (L) 08/12/2022 1438   MCH 22.3 (L) 08/12/2022 1438   MCH 22.6 (L) 01/04/2017 1448   MCHC 29.7 (L) 08/12/2022 1438   MCHC 30.2 (L) 01/04/2017 1448   RDW 23.7 (H) 08/12/2022 1438   LYMPHSABS 3.4 (H) 08/12/2022 1438   MONOABS 1,001 (H) 01/04/2017 1448   EOSABS 0.4 08/12/2022 1438   BASOSABS 0.1 08/12/2022 1438    BMET    Component Value Date/Time   NA 140 08/12/2022 1438   K 3.8 08/12/2022 1438   CL 104 08/12/2022 1438   CO2 21  08/12/2022 1438   GLUCOSE 103 (H) 08/12/2022 1438   GLUCOSE 91 01/04/2017 1448   BUN 4 (L) 08/12/2022 1438   CREATININE 0.78 08/12/2022 1438   CREATININE 0.68 01/04/2017 1448   CALCIUM 9.3 08/12/2022 1438   GFRNONAA 133 02/02/2020 1556   GFRNONAA >89 01/04/2017 1448   GFRAA 154 02/02/2020 1556   GFRAA >89 01/04/2017 1448     Assessment & Plan:   Hb-SS disease without crisis (HCC) - Drug Screen 11 w/Conf, Ser - Sickle Cell Panel  2. Chronic pain syndrome  - Drug Screen 11 w/Conf, Ser  3. Lipid screening  - Lipid Panel  4. Screen for STD (sexually transmitted disease)  - Chlamydia/Gonococcus/Trichomonas, NAA - RPR+HIV+GC+CT Panel  Follow up:  Follow up in 3 months     Ivonne Andrew, NP 11/19/2022

## 2022-11-22 LAB — CHLAMYDIA/GONOCOCCUS/TRICHOMONAS, NAA
Chlamydia by NAA: NEGATIVE
Gonococcus by NAA: NEGATIVE
Trich vag by NAA: NEGATIVE

## 2022-11-24 LAB — THC,MS,WB/SP RFX

## 2022-11-25 LAB — CMP14+CBC/D/PLT+FER+RETIC+V...
ALT: 40 IU/L (ref 0–44)
AST: 51 IU/L — ABNORMAL HIGH (ref 0–40)
Albumin/Globulin Ratio: 1.3 (ref 1.2–2.2)
Albumin: 4.6 g/dL (ref 4.3–5.2)
Alkaline Phosphatase: 96 IU/L (ref 44–121)
BUN/Creatinine Ratio: 8 — ABNORMAL LOW (ref 9–20)
BUN: 7 mg/dL (ref 6–20)
Basophils Absolute: 0.1 10*3/uL (ref 0.0–0.2)
Basos: 1 %
Bilirubin Total: 1.4 mg/dL — ABNORMAL HIGH (ref 0.0–1.2)
CO2: 21 mmol/L (ref 20–29)
Calcium: 9.5 mg/dL (ref 8.7–10.2)
Chloride: 100 mmol/L (ref 96–106)
Creatinine, Ser: 0.83 mg/dL (ref 0.76–1.27)
EOS (ABSOLUTE): 0.2 10*3/uL (ref 0.0–0.4)
Eos: 2 %
Ferritin: 53 ng/mL (ref 30–400)
Globulin, Total: 3.6 g/dL (ref 1.5–4.5)
Glucose: 90 mg/dL (ref 70–99)
Hematocrit: 26.9 % — ABNORMAL LOW (ref 37.5–51.0)
Hemoglobin: 8.2 g/dL — ABNORMAL LOW (ref 13.0–17.7)
Immature Grans (Abs): 0 10*3/uL (ref 0.0–0.1)
Immature Granulocytes: 0 %
Lymphocytes Absolute: 2 10*3/uL (ref 0.7–3.1)
Lymphs: 23 %
MCH: 22.8 pg — ABNORMAL LOW (ref 26.6–33.0)
MCHC: 30.5 g/dL — ABNORMAL LOW (ref 31.5–35.7)
MCV: 75 fL — ABNORMAL LOW (ref 79–97)
Monocytes Absolute: 0.8 10*3/uL (ref 0.1–0.9)
Monocytes: 9 %
NRBC: 1 % — ABNORMAL HIGH (ref 0–0)
Neutrophils Absolute: 5.7 10*3/uL (ref 1.4–7.0)
Neutrophils: 65 %
Platelets: 610 10*3/uL — ABNORMAL HIGH (ref 150–450)
Potassium: 4 mmol/L (ref 3.5–5.2)
RBC: 3.59 x10E6/uL — ABNORMAL LOW (ref 4.14–5.80)
RDW: 23.9 % — ABNORMAL HIGH (ref 11.6–15.4)
Retic Ct Pct: 5.8 % — ABNORMAL HIGH (ref 0.6–2.6)
Sodium: 134 mmol/L (ref 134–144)
Total Protein: 8.2 g/dL (ref 6.0–8.5)
Vit D, 25-Hydroxy: 21.1 ng/mL — ABNORMAL LOW (ref 30.0–100.0)
WBC: 8.7 10*3/uL (ref 3.4–10.8)
eGFR: 124 mL/min/{1.73_m2} (ref >59)

## 2022-11-25 LAB — RPR+HIV+GC+CT PANEL
Chlamydia trachomatis, NAA: NEGATIVE
HIV Screen 4th Generation wRfx: NONREACTIVE
Neisseria Gonorrhoeae by PCR: NEGATIVE
RPR Ser Ql: NONREACTIVE

## 2022-11-25 LAB — DRUG SCREEN 11 W/CONF, SE
Amphetamines, IA: NEGATIVE ng/mL
Barbiturates, IA: NEGATIVE ug/mL
Benzodiazepines, IA: NEGATIVE ng/mL
Cocaine & Metabolite, IA: NEGATIVE ng/mL
Ethyl Alcohol, Enz: NEGATIVE gm/dL
Methadone, IA: NEGATIVE ng/mL
Opiates, IA: NEGATIVE ng/mL
Oxycodones, IA: NEGATIVE ng/mL
Phencyclidine, IA: NEGATIVE ng/mL
Propoxyphene, IA: NEGATIVE ng/mL

## 2022-11-25 LAB — LIPID PANEL
Chol/HDL Ratio: 4.6 ratio (ref 0.0–5.0)
Cholesterol, Total: 162 mg/dL (ref 100–199)
HDL: 35 mg/dL — ABNORMAL LOW (ref >39)
LDL Chol Calc (NIH): 115 mg/dL — ABNORMAL HIGH (ref 0–99)
Triglycerides: 63 mg/dL (ref 0–149)
VLDL Cholesterol Cal: 12 mg/dL (ref 5–40)

## 2022-11-25 LAB — THC,MS,WB/SP RFX

## 2022-11-27 ENCOUNTER — Other Ambulatory Visit: Payer: Self-pay | Admitting: Nurse Practitioner

## 2022-11-27 DIAGNOSIS — D571 Sickle-cell disease without crisis: Secondary | ICD-10-CM

## 2022-11-27 MED ORDER — OXYCODONE-ACETAMINOPHEN 10-325 MG PO TABS
1.0000 | ORAL_TABLET | Freq: Three times a day (TID) | ORAL | 0 refills | Status: DC | PRN
Start: 2022-11-27 — End: 2022-12-13

## 2022-12-13 ENCOUNTER — Other Ambulatory Visit: Payer: Self-pay | Admitting: Nurse Practitioner

## 2022-12-13 DIAGNOSIS — D571 Sickle-cell disease without crisis: Secondary | ICD-10-CM

## 2022-12-13 MED ORDER — OXYCODONE-ACETAMINOPHEN 10-325 MG PO TABS
1.0000 | ORAL_TABLET | Freq: Three times a day (TID) | ORAL | 0 refills | Status: DC | PRN
Start: 2022-12-13 — End: 2022-12-25

## 2022-12-22 ENCOUNTER — Other Ambulatory Visit: Payer: Self-pay

## 2022-12-22 DIAGNOSIS — D571 Sickle-cell disease without crisis: Secondary | ICD-10-CM

## 2022-12-22 NOTE — Telephone Encounter (Signed)
From: Hart E Luger To: Office of Ivonne Andrew, NP Sent: 12/22/2022 11:29 AM EDT Subject: Medication Renewal Request  Refills have been requested for the following medications:   oxyCODONE-acetaminophen (PERCOCET) 10-325 MG tablet Gildardo Pounds Nichols]  Preferred pharmacy: [id number] [place], [place] - [address] Delivery method: [employer]

## 2022-12-22 NOTE — Telephone Encounter (Signed)
Please advise KH 

## 2022-12-23 ENCOUNTER — Ambulatory Visit (INDEPENDENT_AMBULATORY_CARE_PROVIDER_SITE_OTHER): Payer: Medicaid Other | Admitting: Nurse Practitioner

## 2022-12-23 ENCOUNTER — Encounter: Payer: Self-pay | Admitting: Nurse Practitioner

## 2022-12-23 VITALS — BP 128/66 | HR 82 | Temp 98.3°F | Ht 73.0 in | Wt 164.4 lb

## 2022-12-23 DIAGNOSIS — G43809 Other migraine, not intractable, without status migrainosus: Secondary | ICD-10-CM

## 2022-12-23 DIAGNOSIS — G43909 Migraine, unspecified, not intractable, without status migrainosus: Secondary | ICD-10-CM | POA: Insufficient documentation

## 2022-12-23 MED ORDER — KETOROLAC TROMETHAMINE 60 MG/2ML IM SOLN
60.0000 mg | Freq: Once | INTRAMUSCULAR | Status: DC
Start: 2022-12-23 — End: 2022-12-23

## 2022-12-23 NOTE — Assessment & Plan Note (Signed)
-  Discussed need for good hydration  -Monitor hydration status  -Avoid heat, cold, stress, and infectious triggers  -Discussed the importance of adherence to medication regimen  -Please seek medical attention immediately if any symptoms of bleeding, anemia, infection occurs   Follow up:  Follow up as scheduled

## 2022-12-23 NOTE — Progress Notes (Signed)
@Patient  ID: Derrick Wu, male    DOB: February 15, 1996, 27 y.o.   MRN: [id number]  Chief Complaint  Patient presents with   Migraine    Eye pressure.    Referring provider: Ivonne Andrew, NP   HPI  Patient presents today for an acute visit.  He states that he has been having bad migraine type headaches since July 4.  He states that over the past week and he was having bodyaches and chills.  We will test for COVID in office today.  We will give Toradol injection in office today. Denies f/c/s, n/v/d, hemoptysis, PND, leg swelling Denies chest pain or edema  Note: COVID and flu were both negative in office today.  Offered patient Toradol injection for headache but he refuses at this time.  He states that he would like to trial rest and fluids at home.  We discussed that he can come back if symptoms worsen.   Allergies  Allergen Reactions   Mushroom Extract Complex Nausea And Vomiting    Immunization History  Administered Date(s) Administered   Meningococcal Conjugate 02/22/2007, 11/05/2010   Pneumococcal Polysaccharide-23 11/23/2001, 02/22/2007, 01/08/2016   Tdap 01/27/2022    Past Medical History:  Diagnosis Date   Asthma    Headache(784.0)    Pneumonia    Seasonal allergies    Sickle cell anemia (HCC)    Vision abnormalities    Hx: of wears glasses   Vitamin D deficiency 09/2020    Tobacco History: Social History   Tobacco Use  Smoking Status Never  Smokeless Tobacco Never   Counseling given: Not Answered   Outpatient Encounter Medications as of 12/23/2022  Medication Sig   folic acid (FOLVITE) 1 MG tablet Take 1 tablet (1 mg total) by mouth daily.   ibuprofen (ADVIL) 800 MG tablet Take 1 tablet (800 mg total) by mouth every 8 (eight) hours as needed.   Multiple Vitamins-Minerals (ONE DAILY MULTIVIT-MIN ADULT) TABS Take 1 tablet by mouth daily.   oxyCODONE-acetaminophen (PERCOCET) 10-325 MG tablet Take 1 tablet by mouth every 8 (eight) hours as needed  for pain. Take 1 tablet by mouth every 8 hours as needed for 15 days.   Vitamin D, Ergocalciferol, (DRISDOL) 1.25 MG (50000 UNIT) CAPS capsule Take 1 capsule (50,000 Units total) by mouth every 7 (seven) days. (Patient not taking: Reported on 11/19/2022)   cetirizine (ZYRTEC) 10 MG tablet TAKE 1 TABLET BY MOUTH EVERY DAY (Patient not taking: Reported on 11/19/2022)   [DISCONTINUED] ketorolac (TORADOL) injection 60 mg    No facility-administered encounter medications on file as of 12/23/2022.     Review of Systems  Review of Systems  Constitutional:  Positive for chills.  HENT: Negative.    Cardiovascular: Negative.   Gastrointestinal: Negative.   Allergic/Immunologic: Negative.   Neurological:  Positive for headaches.  Psychiatric/Behavioral: Negative.         Physical Exam  BP 128/66   Pulse 82   Temp 98.3 F (36.8 C)   Ht 6\' 1"  (1.854 m)   Wt 164 lb 6.4 oz (74.6 kg)   SpO2 98%   BMI 21.69 kg/m   Wt Readings from Last 5 Encounters:  12/23/22 164 lb 6.4 oz (74.6 kg)  11/19/22 168 lb (76.2 kg)  08/12/22 173 lb (78.5 kg)  04/29/22 170 lb (77.1 kg)  01/19/22 168 lb 4 oz (76.3 kg)     Physical Exam Vitals and nursing note reviewed.  Constitutional:      General: He is  not in acute distress.    Appearance: He is well-developed.  Cardiovascular:     Rate and Rhythm: Normal rate and regular rhythm.  Pulmonary:     Effort: Pulmonary effort is normal.     Breath sounds: Normal breath sounds.  Skin:    General: Skin is warm and dry.  Neurological:     Mental Status: He is alert and oriented to person, place, and time.      Lab Results:  CBC    Component Value Date/Time   WBC 8.7 11/19/2022 0945   WBC 7.7 01/04/2017 1448   RBC 3.59 (L) 11/19/2022 0945   RBC 3.50 (L) 01/04/2017 1448   RBC 3.50 (L) 01/04/2017 1448   HGB 8.2 (L) 11/19/2022 0945   HCT 26.9 (L) 11/19/2022 0945   PLT 610 (H) 11/19/2022 0945   MCV 75 (L) 11/19/2022 0945   MCH 22.8 (L) 11/19/2022  0945   MCH 22.6 (L) 01/04/2017 1448   MCHC 30.5 (L) 11/19/2022 0945   MCHC 30.2 (L) 01/04/2017 1448   RDW 23.9 (H) 11/19/2022 0945   LYMPHSABS 2.0 11/19/2022 0945   MONOABS 1,001 (H) 01/04/2017 1448   EOSABS 0.2 11/19/2022 0945   BASOSABS 0.1 11/19/2022 0945    BMET    Component Value Date/Time   NA 134 11/19/2022 0945   K 4.0 11/19/2022 0945   CL 100 11/19/2022 0945   CO2 21 11/19/2022 0945   GLUCOSE 90 11/19/2022 0945   GLUCOSE 91 01/04/2017 1448   BUN 7 11/19/2022 0945   CREATININE 0.83 11/19/2022 0945   CREATININE 0.68 01/04/2017 1448   CALCIUM 9.5 11/19/2022 0945   GFRNONAA 133 02/02/2020 1556   GFRNONAA >89 01/04/2017 1448   GFRAA 154 02/02/2020 1556   GFRAA >89 01/04/2017 1448      Assessment & Plan:   Migraine -Discussed need for good hydration  -Monitor hydration status  -Avoid heat, cold, stress, and infectious triggers  -Discussed the importance of adherence to medication regimen  -Please seek medical attention immediately if any symptoms of bleeding, anemia, infection occurs   Follow up:  Follow up as scheduled     Ivonne Andrew, NP 12/23/2022

## 2022-12-23 NOTE — Patient Instructions (Addendum)
1. Other migraine without status migrainosus, not intractable  -Discussed need for good hydration  -Monitor hydration status  -Avoid heat, cold, stress, and infectious triggers  -Discussed the importance of adherence to medication regimen  -Please seek medical attention immediately if any symptoms of bleeding, anemia, infection occurs   Follow up:  Follow up as scheduled

## 2022-12-25 ENCOUNTER — Other Ambulatory Visit: Payer: Self-pay | Admitting: Nurse Practitioner

## 2022-12-25 DIAGNOSIS — D571 Sickle-cell disease without crisis: Secondary | ICD-10-CM

## 2022-12-25 MED ORDER — OXYCODONE-ACETAMINOPHEN 10-325 MG PO TABS
1.0000 | ORAL_TABLET | Freq: Three times a day (TID) | ORAL | 0 refills | Status: DC | PRN
Start: 2022-12-27 — End: 2023-01-08

## 2023-01-08 ENCOUNTER — Other Ambulatory Visit: Payer: Self-pay

## 2023-01-08 DIAGNOSIS — D571 Sickle-cell disease without crisis: Secondary | ICD-10-CM

## 2023-01-08 MED ORDER — OXYCODONE-ACETAMINOPHEN 10-325 MG PO TABS
1.0000 | ORAL_TABLET | Freq: Three times a day (TID) | ORAL | 0 refills | Status: DC | PRN
Start: 2023-01-10 — End: 2023-01-22

## 2023-01-08 NOTE — Telephone Encounter (Signed)
Please advise Kh 

## 2023-01-11 ENCOUNTER — Other Ambulatory Visit: Payer: Self-pay | Admitting: Nurse Practitioner

## 2023-01-11 NOTE — Telephone Encounter (Signed)
Please advise Kh 

## 2023-01-21 ENCOUNTER — Other Ambulatory Visit: Payer: Self-pay | Admitting: Nurse Practitioner

## 2023-01-22 ENCOUNTER — Other Ambulatory Visit: Payer: Self-pay

## 2023-01-22 DIAGNOSIS — D571 Sickle-cell disease without crisis: Secondary | ICD-10-CM

## 2023-01-22 MED ORDER — OXYCODONE-ACETAMINOPHEN 10-325 MG PO TABS
1.0000 | ORAL_TABLET | Freq: Three times a day (TID) | ORAL | 0 refills | Status: DC | PRN
Start: 2023-01-24 — End: 2023-02-08

## 2023-01-22 NOTE — Telephone Encounter (Signed)
Please advise Kh 

## 2023-02-08 ENCOUNTER — Other Ambulatory Visit: Payer: Self-pay | Admitting: Nurse Practitioner

## 2023-02-08 DIAGNOSIS — D571 Sickle-cell disease without crisis: Secondary | ICD-10-CM

## 2023-02-08 MED ORDER — OXYCODONE-ACETAMINOPHEN 10-325 MG PO TABS
1.0000 | ORAL_TABLET | Freq: Three times a day (TID) | ORAL | 0 refills | Status: DC | PRN
Start: 2023-02-08 — End: 2023-02-22

## 2023-02-19 ENCOUNTER — Ambulatory Visit (INDEPENDENT_AMBULATORY_CARE_PROVIDER_SITE_OTHER): Payer: Medicaid Other | Admitting: Nurse Practitioner

## 2023-02-19 ENCOUNTER — Encounter: Payer: Self-pay | Admitting: Nurse Practitioner

## 2023-02-19 VITALS — BP 129/68 | HR 74 | Ht 73.0 in | Wt 166.0 lb

## 2023-02-19 DIAGNOSIS — Z113 Encounter for screening for infections with a predominantly sexual mode of transmission: Secondary | ICD-10-CM

## 2023-02-19 DIAGNOSIS — Z0000 Encounter for general adult medical examination without abnormal findings: Secondary | ICD-10-CM | POA: Diagnosis not present

## 2023-02-19 DIAGNOSIS — D571 Sickle-cell disease without crisis: Secondary | ICD-10-CM | POA: Diagnosis not present

## 2023-02-19 DIAGNOSIS — Z79891 Long term (current) use of opiate analgesic: Secondary | ICD-10-CM | POA: Diagnosis not present

## 2023-02-19 NOTE — Assessment & Plan Note (Addendum)
-   Sickle Cell Panel - Drug Screen 11 w/Conf, Ser  2. Use of opiates for therapeutic purposes  - Sickle Cell Panel - Drug Screen 11 w/Conf, Ser  3. Screen for STD (sexually transmitted disease)  - Chlamydia/Gonococcus/Trichomonas, NAA - RPR+HIV+GC+CT Panel  4. Routine adult health maintenance  - Sickle Cell Panel - Drug Screen 11 w/Conf, Ser - Chlamydia/Gonococcus/Trichomonas, NAA - RPR+HIV+GC+CT Panel    Follow up:  Follow up in 3 months

## 2023-02-19 NOTE — Patient Instructions (Addendum)
1. 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 Use of opiates for therapeutic purposes  - Sickle Cell Panel - Drug Screen 11 w/Conf, Ser  3. Screen for STD (sexually transmitted disease)  - Chlamydia/Gonococcus/Trichomonas, NAA - RPR+HIV+GC+CT Panel  4. Routine adult health maintenance  - Sickle Cell Panel - Drug Screen 11 w/Conf, Ser - Chlamydia/Gonococcus/Trichomonas, NAA - RPR+HIV+GC+CT Panel    Follow up:  Follow up in 3 months

## 2023-02-19 NOTE — Progress Notes (Signed)
@Patient  ID: Derrick Wu, male    DOB: 01/10/1996, 27 y.o.   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  Patient presents today for sickle cell follow-up and physical.  He is requesting STD testing today. 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 Overall doing well. Denies f/c/s, n/v/d, hemoptysis, PND, leg swelling. Denies chest pain or edema.      Allergies  Allergen Reactions   Mushroom Extract Complex Nausea And Vomiting    Immunization History  Administered Date(s) Administered   Meningococcal Conjugate 02/22/2007, 11/05/2010   Pneumococcal Polysaccharide-23 11/23/2001, 02/22/2007, 01/08/2016   Tdap 01/27/2022    Past Medical History:  Diagnosis Date   Asthma    Headache(784.0)    Pneumonia    Seasonal allergies    Sickle cell anemia (HCC)    Vision abnormalities    Hx: of wears glasses   Vitamin D deficiency 09/2020    Tobacco History: Social History   Tobacco Use  Smoking Status Never  Smokeless Tobacco Never   Counseling given: Not Answered   Outpatient Encounter Medications as of 02/19/2023  Medication Sig   Vitamin D, Ergocalciferol, (DRISDOL) 1.25 MG (50000 UNIT) CAPS capsule Take 1 capsule (50,000 Units total) by mouth every 7 (seven) days. (Patient not taking: Reported on 11/19/2022)   cetirizine (ZYRTEC) 10 MG tablet TAKE 1 TABLET BY MOUTH EVERY DAY (Patient not taking: Reported on 11/19/2022)   folic acid (FOLVITE) 1 MG tablet Take 1 tablet (1 mg total) by mouth daily.   ibuprofen (ADVIL) 800 MG tablet Take 1 tablet (800 mg total) by mouth every 8 (eight) hours as needed.   Multiple Vitamins-Minerals (ONE DAILY MULTIVIT-MIN ADULT) TABS Take 1 tablet by mouth daily.   oxyCODONE-acetaminophen (PERCOCET)  10-325 MG tablet Take 1 tablet by mouth every 8 (eight) hours as needed for pain. Take 1 tablet by mouth every 8 hours as needed for 15 days.   No facility-administered encounter medications on file as of 02/19/2023.     Review of Systems  Review of Systems  Constitutional: Negative.   HENT: Negative.    Cardiovascular: Negative.   Gastrointestinal: Negative.   Allergic/Immunologic: Negative.   Neurological: Negative.   Psychiatric/Behavioral: Negative.         Physical Exam  BP 129/68 (BP Location: Right Arm, Patient Position: Sitting)   Pulse 74   Ht 6\' 1"  (1.854 m)   Wt 166 lb (75.3 kg)   SpO2 97%   BMI 21.90 kg/m   Wt Readings from Last 5 Encounters:  02/19/23 166 lb (75.3 kg)  12/23/22 164 lb 6.4 oz (74.6 kg)  11/19/22 168 lb (76.2 kg)  08/12/22 173 lb (78.5 kg)  04/29/22 170 lb (77.1 kg)     Physical Exam Vitals and nursing note reviewed.  Constitutional:      General: He is not in acute distress.    Appearance: He is well-developed.  Cardiovascular:     Rate and Rhythm: Normal rate and regular rhythm.  Pulmonary:     Effort: Pulmonary effort is normal.     Breath sounds: Normal breath sounds.  Skin:    General: Skin is warm and dry.  Neurological:     Mental Status: He is alert and oriented to person, place, and  time.      Lab Results:  CBC    Component Value Date/Time   WBC 8.7 11/19/2022 0945   WBC 7.7 01/04/2017 1448   RBC 3.59 (L) 11/19/2022 0945   RBC 3.50 (L) 01/04/2017 1448   RBC 3.50 (L) 01/04/2017 1448   HGB 8.2 (L) 11/19/2022 0945   HCT 26.9 (L) 11/19/2022 0945   PLT 610 (H) 11/19/2022 0945   MCV 75 (L) 11/19/2022 0945   MCH 22.8 (L) 11/19/2022 0945   MCH 22.6 (L) 01/04/2017 1448   MCHC 30.5 (L) 11/19/2022 0945   MCHC 30.2 (L) 01/04/2017 1448   RDW 23.9 (H) 11/19/2022 0945   LYMPHSABS 2.0 11/19/2022 0945   MONOABS 1,001 (H) 01/04/2017 1448   EOSABS 0.2 11/19/2022 0945   BASOSABS 0.1 11/19/2022 0945    BMET    Component  Value Date/Time   NA 134 11/19/2022 0945   K 4.0 11/19/2022 0945   CL 100 11/19/2022 0945   CO2 21 11/19/2022 0945   GLUCOSE 90 11/19/2022 0945   GLUCOSE 91 01/04/2017 1448   BUN 7 11/19/2022 0945   CREATININE 0.83 11/19/2022 0945   CREATININE 0.68 01/04/2017 1448   CALCIUM 9.5 11/19/2022 0945   GFRNONAA 133 02/02/2020 1556   GFRNONAA >89 01/04/2017 1448   GFRAA 154 02/02/2020 1556   GFRAA >89 01/04/2017 1448     Assessment & Plan:   Sickle cell anemia (HCC) - Sickle Cell Panel - Drug Screen 11 w/Conf, Ser  2. Use of opiates for therapeutic purposes  - Sickle Cell Panel - Drug Screen 11 w/Conf, Ser  3. Screen for STD (sexually transmitted disease)  - Chlamydia/Gonococcus/Trichomonas, NAA - RPR+HIV+GC+CT Panel  4. Routine adult health maintenance  - Sickle Cell Panel - Drug Screen 11 w/Conf, Ser - Chlamydia/Gonococcus/Trichomonas, NAA - RPR+HIV+GC+CT Panel    Follow up:  Follow up in 3 months     Ivonne Andrew, NP 02/19/2023

## 2023-02-22 ENCOUNTER — Other Ambulatory Visit: Payer: Self-pay | Admitting: Nurse Practitioner

## 2023-02-22 DIAGNOSIS — D571 Sickle-cell disease without crisis: Secondary | ICD-10-CM

## 2023-02-22 MED ORDER — OXYCODONE-ACETAMINOPHEN 10-325 MG PO TABS: 1.0000 | ORAL_TABLET | Freq: Three times a day (TID) | ORAL | 0 refills | Status: DC | PRN

## 2023-02-22 NOTE — Progress Notes (Signed)
PDMP reviewed during this encounter.  

## 2023-02-23 LAB — CHLAMYDIA/GONOCOCCUS/TRICHOMONAS, NAA
Chlamydia by NAA: NEGATIVE
Gonococcus by NAA: NEGATIVE
Trich vag by NAA: NEGATIVE

## 2023-02-28 LAB — RPR+HIV+GC+CT PANEL
Chlamydia trachomatis, NAA: NEGATIVE
HIV Screen 4th Generation wRfx: NONREACTIVE
Neisseria Gonorrhoeae by PCR: NEGATIVE
RPR Ser Ql: NONREACTIVE

## 2023-02-28 LAB — CMP14+CBC/D/PLT+FER+RETIC+V...
ALT: 34 IU/L (ref 0–44)
AST: 45 IU/L — ABNORMAL HIGH (ref 0–40)
Albumin: 4.6 g/dL (ref 4.3–5.2)
Alkaline Phosphatase: 106 IU/L (ref 44–121)
BUN/Creatinine Ratio: 11 (ref 9–20)
BUN: 8 mg/dL (ref 6–20)
Basophils Absolute: 0.1 10*3/uL (ref 0.0–0.2)
Basos: 1 %
Bilirubin Total: 1 mg/dL (ref 0.0–1.2)
CO2: 22 mmol/L (ref 20–29)
Calcium: 9.9 mg/dL (ref 8.7–10.2)
Chloride: 103 mmol/L (ref 96–106)
Creatinine, Ser: 0.72 mg/dL — ABNORMAL LOW (ref 0.76–1.27)
EOS (ABSOLUTE): 0.2 10*3/uL (ref 0.0–0.4)
Eos: 2 %
Ferritin: 47 ng/mL (ref 30–400)
Globulin, Total: 3.9 g/dL (ref 1.5–4.5)
Glucose: 92 mg/dL (ref 70–99)
Hematocrit: 29 % — ABNORMAL LOW (ref 37.5–51.0)
Hemoglobin: 8.6 g/dL — ABNORMAL LOW (ref 13.0–17.7)
Immature Grans (Abs): 0 10*3/uL (ref 0.0–0.1)
Immature Granulocytes: 0 %
Lymphocytes Absolute: 1.8 10*3/uL (ref 0.7–3.1)
Lymphs: 18 %
MCH: 22 pg — ABNORMAL LOW (ref 26.6–33.0)
MCHC: 29.7 g/dL — ABNORMAL LOW (ref 31.5–35.7)
MCV: 74 fL — ABNORMAL LOW (ref 79–97)
Monocytes Absolute: 0.8 10*3/uL (ref 0.1–0.9)
Monocytes: 8 %
NRBC: 1 % — ABNORMAL HIGH (ref 0–0)
Neutrophils Absolute: 7 10*3/uL (ref 1.4–7.0)
Neutrophils: 71 %
Platelets: 713 10*3/uL — ABNORMAL HIGH (ref 150–450)
Potassium: 4.2 mmol/L (ref 3.5–5.2)
RBC: 3.91 x10E6/uL — ABNORMAL LOW (ref 4.14–5.80)
RDW: 20.1 % — ABNORMAL HIGH (ref 11.6–15.4)
Retic Ct Pct: 4.3 % — ABNORMAL HIGH (ref 0.6–2.6)
Sodium: 138 mmol/L (ref 134–144)
Total Protein: 8.5 g/dL (ref 6.0–8.5)
Vit D, 25-Hydroxy: 38.4 ng/mL (ref 30.0–100.0)
WBC: 10 10*3/uL (ref 3.4–10.8)
eGFR: 129 mL/min/{1.73_m2} (ref >59)

## 2023-02-28 LAB — THC,MS,WB/SP RFX

## 2023-02-28 LAB — DRUG SCREEN 11 W/CONF, SE
Amphetamines, IA: NEGATIVE ng/mL
Barbiturates, IA: NEGATIVE ug/mL
Benzodiazepines, IA: NEGATIVE ng/mL
Cocaine & Metabolite, IA: NEGATIVE ng/mL
Ethyl Alcohol, Enz: NEGATIVE g/dL
Methadone, IA: NEGATIVE ng/mL
Opiates, IA: NEGATIVE ng/mL
Oxycodones, IA: NEGATIVE ng/mL
Phencyclidine, IA: NEGATIVE ng/mL
Propoxyphene, IA: NEGATIVE ng/mL

## 2023-03-10 ENCOUNTER — Other Ambulatory Visit: Payer: Self-pay | Admitting: Nurse Practitioner

## 2023-03-10 DIAGNOSIS — D571 Sickle-cell disease without crisis: Secondary | ICD-10-CM

## 2023-03-10 MED ORDER — OXYCODONE-ACETAMINOPHEN 10-325 MG PO TABS
1.0000 | ORAL_TABLET | Freq: Three times a day (TID) | ORAL | 0 refills | Status: DC | PRN
Start: 2023-03-10 — End: 2023-03-23

## 2023-03-23 ENCOUNTER — Other Ambulatory Visit: Payer: Self-pay

## 2023-03-23 DIAGNOSIS — D571 Sickle-cell disease without crisis: Secondary | ICD-10-CM

## 2023-03-23 NOTE — Telephone Encounter (Signed)
LOV 02/19/23 Next OV 06/10/23 Last refill 03/10/23, #30, 0 refills  Please review, thanks!

## 2023-03-24 MED ORDER — OXYCODONE-ACETAMINOPHEN 10-325 MG PO TABS: 1.0000 | ORAL_TABLET | Freq: Three times a day (TID) | ORAL | 0 refills | Status: DC | PRN

## 2023-04-07 ENCOUNTER — Other Ambulatory Visit: Payer: Self-pay | Admitting: Nurse Practitioner

## 2023-04-07 DIAGNOSIS — D571 Sickle-cell disease without crisis: Secondary | ICD-10-CM

## 2023-04-07 MED ORDER — OXYCODONE-ACETAMINOPHEN 10-325 MG PO TABS: 1.0000 | ORAL_TABLET | Freq: Three times a day (TID) | ORAL | 0 refills | Status: DC | PRN

## 2023-04-21 ENCOUNTER — Other Ambulatory Visit: Payer: Self-pay

## 2023-04-21 DIAGNOSIS — D571 Sickle-cell disease without crisis: Secondary | ICD-10-CM

## 2023-04-21 NOTE — Telephone Encounter (Signed)
Please advise Kh

## 2023-04-22 MED ORDER — OXYCODONE-ACETAMINOPHEN 10-325 MG PO TABS: 1.0000 | ORAL_TABLET | Freq: Three times a day (TID) | ORAL | 0 refills | Status: DC | PRN

## 2023-05-04 ENCOUNTER — Other Ambulatory Visit: Payer: Self-pay | Admitting: Nurse Practitioner

## 2023-05-04 DIAGNOSIS — D571 Sickle-cell disease without crisis: Secondary | ICD-10-CM

## 2023-05-04 MED ORDER — OXYCODONE-ACETAMINOPHEN 10-325 MG PO TABS: 1.0000 | ORAL_TABLET | Freq: Three times a day (TID) | ORAL | 0 refills | Status: DC | PRN

## 2023-05-20 ENCOUNTER — Other Ambulatory Visit: Payer: Self-pay | Admitting: Nurse Practitioner

## 2023-05-20 DIAGNOSIS — D571 Sickle-cell disease without crisis: Secondary | ICD-10-CM

## 2023-05-20 MED ORDER — OXYCODONE-ACETAMINOPHEN 10-325 MG PO TABS: 1.0000 | ORAL_TABLET | Freq: Three times a day (TID) | ORAL | 0 refills | Status: DC | PRN

## 2023-06-03 ENCOUNTER — Other Ambulatory Visit: Payer: Self-pay | Admitting: Nurse Practitioner

## 2023-06-03 DIAGNOSIS — D571 Sickle-cell disease without crisis: Secondary | ICD-10-CM

## 2023-06-03 MED ORDER — OXYCODONE-ACETAMINOPHEN 10-325 MG PO TABS: 1.0000 | ORAL_TABLET | Freq: Three times a day (TID) | ORAL | 0 refills | Status: DC | PRN

## 2023-06-10 ENCOUNTER — Ambulatory Visit: Payer: Self-pay | Admitting: Nurse Practitioner

## 2023-06-18 ENCOUNTER — Other Ambulatory Visit: Payer: Self-pay | Admitting: Nurse Practitioner

## 2023-06-18 DIAGNOSIS — D571 Sickle-cell disease without crisis: Secondary | ICD-10-CM

## 2023-06-18 MED ORDER — OXYCODONE-ACETAMINOPHEN 10-325 MG PO TABS: 1.0000 | ORAL_TABLET | Freq: Three times a day (TID) | ORAL | 0 refills | Status: DC | PRN

## 2023-07-01 ENCOUNTER — Other Ambulatory Visit: Payer: Self-pay | Admitting: Nurse Practitioner

## 2023-07-01 DIAGNOSIS — D571 Sickle-cell disease without crisis: Secondary | ICD-10-CM

## 2023-07-01 MED ORDER — OXYCODONE-ACETAMINOPHEN 10-325 MG PO TABS: 1.0000 | ORAL_TABLET | Freq: Three times a day (TID) | ORAL | 0 refills | Status: DC | PRN

## 2023-07-15 ENCOUNTER — Other Ambulatory Visit: Payer: Self-pay

## 2023-07-15 DIAGNOSIS — D571 Sickle-cell disease without crisis: Secondary | ICD-10-CM

## 2023-07-15 MED ORDER — OXYCODONE-ACETAMINOPHEN 10-325 MG PO TABS: 1.0000 | ORAL_TABLET | Freq: Three times a day (TID) | ORAL | 0 refills | Status: DC | PRN

## 2023-07-30 ENCOUNTER — Other Ambulatory Visit: Payer: Self-pay | Admitting: Nurse Practitioner

## 2023-07-30 DIAGNOSIS — D571 Sickle-cell disease without crisis: Secondary | ICD-10-CM

## 2023-07-30 MED ORDER — OXYCODONE-ACETAMINOPHEN 10-325 MG PO TABS: 1.0000 | ORAL_TABLET | Freq: Three times a day (TID) | ORAL | 0 refills | Status: DC | PRN

## 2023-08-13 ENCOUNTER — Encounter: Payer: Self-pay | Admitting: Nurse Practitioner

## 2023-08-13 ENCOUNTER — Ambulatory Visit (INDEPENDENT_AMBULATORY_CARE_PROVIDER_SITE_OTHER): Payer: Medicaid Other | Admitting: Nurse Practitioner

## 2023-08-13 VITALS — BP 130/67 | HR 83 | Temp 97.9°F | Wt 176.2 lb

## 2023-08-13 DIAGNOSIS — F419 Anxiety disorder, unspecified: Secondary | ICD-10-CM | POA: Diagnosis not present

## 2023-08-13 DIAGNOSIS — Z113 Encounter for screening for infections with a predominantly sexual mode of transmission: Secondary | ICD-10-CM

## 2023-08-13 DIAGNOSIS — D571 Sickle-cell disease without crisis: Secondary | ICD-10-CM

## 2023-08-13 MED ORDER — FEXOFENADINE HCL 180 MG PO TABS: 180.0000 mg | ORAL_TABLET | Freq: Every day | ORAL | 2 refills | Status: DC

## 2023-08-13 NOTE — Patient Instructions (Signed)
 1. Sickle cell disease without crisis (HCC) (Primary)  - Sickle Cell Panel - Drug Screen 11 w/Conf, Ser  2. Anxiety  - AMB Referral VBCI Care Management  3. Screen for STD (sexually transmitted disease)  - Chlamydia/Gonococcus/Trichomonas, NAA - RPR - HIV antibody (with reflex)

## 2023-08-13 NOTE — Progress Notes (Signed)
 Subjective   Patient ID: Derrick Wu, male    DOB: 1996-06-03, 28 y.o.   MRN: [id number]  Chief Complaint  Patient presents with   Medical Management of Chronic Issues    Patient states that he needs a blood test  STD test Allergies has come back     Referring provider: Ivonne Andrew, NP  Robet E Guinyard is a 28 y.o. 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   Patient presents today for sickle cell follow-up.  He is requesting STD testing today. 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 Overall doing well. Is having a lot of life stressors right now. Will place referral for counseling. Denies f/c/s, n/v/d, hemoptysis, PND, leg swelling. Denies chest pain or edema.     Allergies  Allergen Reactions   Mushroom Extract Complex (Obsolete) Nausea And Vomiting    Immunization History  Administered Date(s) Administered   Meningococcal Conjugate 02/22/2007, 11/05/2010   Pneumococcal Polysaccharide-23 11/23/2001, 02/22/2007, 01/08/2016   Tdap 01/27/2022    Tobacco History: Social History   Tobacco Use  Smoking Status Never  Smokeless Tobacco Never   Counseling given: Not Answered   Outpatient Encounter Medications as of 08/13/2023  Medication Sig   fexofenadine (ALLEGRA ALLERGY) 180 MG tablet Take 1 tablet (180 mg total) by mouth daily.   oxyCODONE-acetaminophen (PERCOCET) 10-325 MG tablet Take 1 tablet by mouth every 8 (eight) hours as needed for pain. Take 1 tablet by mouth every 8 hours as needed for 15 days.   Vitamin D, Ergocalciferol, (DRISDOL) 1.25 MG (50000 UNIT) CAPS  capsule Take 1 capsule (50,000 Units total) by mouth every 7 (seven) days. (Patient not taking: Reported on 08/13/2023)   cetirizine (ZYRTEC) 10 MG tablet TAKE 1 TABLET BY MOUTH EVERY DAY (Patient not taking: Reported on 08/13/2023)   folic acid (FOLVITE) 1 MG tablet Take 1 tablet (1 mg total) by mouth daily. (Patient not taking: Reported on 08/13/2023)   ibuprofen (ADVIL) 800 MG tablet Take 1 tablet (800 mg total) by mouth every 8 (eight) hours as needed. (Patient not taking: Reported on 08/13/2023)   No facility-administered encounter medications on file as of 08/13/2023.    Review of Systems  Review of Systems  Constitutional: Negative.   HENT: Negative.    Cardiovascular: Negative.   Gastrointestinal: Negative.   Allergic/Immunologic: Negative.   Neurological: Negative.   Psychiatric/Behavioral: Negative.       Objective:   BP 130/67   Pulse 83   Temp 97.9 F (36.6 C)   Wt 176 lb 3.2 oz (79.9 kg)   SpO2 99%   BMI 23.25 kg/m   Wt Readings from Last 5 Encounters:  08/13/23 176 lb 3.2 oz (79.9 kg)  02/19/23 166 lb (75.3 kg)  12/23/22 164 lb 6.4 oz (74.6 kg)  11/19/22 168 lb (76.2 kg)  08/12/22 173 lb (78.5 kg)     Physical Exam Vitals and nursing note reviewed.  Constitutional:      General: He is not in acute distress.    Appearance: He is well-developed.  Cardiovascular:     Rate and Rhythm: Normal rate and regular rhythm.  Pulmonary:     Effort: Pulmonary effort is normal.     Breath sounds: Normal breath sounds.  Skin:    General: Skin is warm and dry.  Neurological:     Mental Status: He is alert and oriented to person, place, and time.       Assessment & Plan:   Sickle cell disease without crisis (HCC) -     Sickle Cell Panel -     Drug Screen 11 w/Conf, Ser  Anxiety -     AMB Referral VBCI Care Management  Screen for STD (sexually transmitted disease) -     Chlamydia/Gonococcus/Trichomonas, NAA -     RPR -     HIV Antibody (routine testing w  rflx)  Other orders -     Fexofenadine HCl; Take 1 tablet (180 mg total) by mouth daily.  Dispense: 90 tablet; Refill: 2     No follow-ups on file.   Ivonne Andrew, NP 08/13/2023

## 2023-08-14 ENCOUNTER — Other Ambulatory Visit: Payer: Self-pay | Admitting: Nurse Practitioner

## 2023-08-14 DIAGNOSIS — D571 Sickle-cell disease without crisis: Secondary | ICD-10-CM

## 2023-08-16 ENCOUNTER — Other Ambulatory Visit: Payer: Self-pay

## 2023-08-16 ENCOUNTER — Other Ambulatory Visit: Payer: Self-pay | Admitting: Nurse Practitioner

## 2023-08-16 DIAGNOSIS — E559 Vitamin D deficiency, unspecified: Secondary | ICD-10-CM

## 2023-08-16 DIAGNOSIS — D571 Sickle-cell disease without crisis: Secondary | ICD-10-CM

## 2023-08-16 LAB — CHLAMYDIA/GONOCOCCUS/TRICHOMONAS, NAA
Chlamydia by NAA: NEGATIVE
Gonococcus by NAA: NEGATIVE
Trich vag by NAA: NEGATIVE

## 2023-08-16 MED ORDER — VITAMIN D (ERGOCALCIFEROL) 1.25 MG (50000 UNIT) PO CAPS
50000.0000 [IU] | ORAL_CAPSULE | ORAL | 5 refills | Status: DC
Start: 2023-08-16 — End: 2023-11-15

## 2023-08-16 MED ORDER — OXYCODONE-ACETAMINOPHEN 10-325 MG PO TABS: 1.0000 | ORAL_TABLET | Freq: Three times a day (TID) | ORAL | 0 refills | Status: DC | PRN

## 2023-08-16 NOTE — Telephone Encounter (Signed)
 Please advise [facility]

## 2023-08-20 ENCOUNTER — Other Ambulatory Visit: Payer: Self-pay | Admitting: Obstetrics and Gynecology

## 2023-08-20 NOTE — Patient Outreach (Signed)
  Medicaid Managed Care   Unsuccessful Attempt Note   08/20/2023 Name: Derrick Wu MRN: [id number] DOB: 10-10-1995  Referred by: Ivonne Andrew, NP Reason for referral : High Risk Managed Medicaid (Unsuccessful telephone outreach)  An unsuccessful telephone outreach was attempted today. The patient was referred to the case management team for assistance with care management and care coordination.    Follow Up Plan: The Managed Medicaid care management team will reach out to the patient again over the next 30 business  days. and The  Patient has been provided with contact information for the Managed Medicaid care management team and has been advised to call with any health related questions or concerns.   Kathi Der RNC, BSN, [facility] RN Care Manager Direct Dial [phone] Website: [email or web address]

## 2023-08-20 NOTE — Patient Instructions (Signed)
 Visit Information  Mr. Derrick Wu  - as a part of your Medicaid benefit, you are eligible for care management and care coordination services at no cost or copay. I was unable to reach you by phone today but would be happy to help you with your health related needs. Please feel free to call me at [phone].  A member of the Managed Medicaid care management team will reach out to you again over the next 30 business  days.   Kathi Der RNC, BSN, [employer] [facility] RN Care Manager Direct Dial [phone] Website: [email or web address]

## 2023-08-21 LAB — CMP14+CBC/D/PLT+FER+RETIC+V...
ALT: 38 IU/L (ref 0–44)
AST: 45 IU/L — ABNORMAL HIGH (ref 0–40)
Albumin: 4.7 g/dL (ref 4.3–5.2)
Alkaline Phosphatase: 110 IU/L (ref 44–121)
BUN/Creatinine Ratio: 11 (ref 9–20)
BUN: 8 mg/dL (ref 6–20)
Basophils Absolute: 0.1 10*3/uL (ref 0.0–0.2)
Basos: 1 %
Bilirubin Total: 1 mg/dL (ref 0.0–1.2)
CO2: 23 mmol/L (ref 20–29)
Calcium: 9.8 mg/dL (ref 8.7–10.2)
Chloride: 102 mmol/L (ref 96–106)
Creatinine, Ser: 0.7 mg/dL — ABNORMAL LOW (ref 0.76–1.27)
EOS (ABSOLUTE): 0.4 10*3/uL (ref 0.0–0.4)
Eos: 4 %
Ferritin: 39 ng/mL (ref 30–400)
Globulin, Total: 3.5 g/dL (ref 1.5–4.5)
Glucose: 80 mg/dL (ref 70–99)
Hematocrit: 29.3 % — ABNORMAL LOW (ref 37.5–51.0)
Hemoglobin: 8.4 g/dL — ABNORMAL LOW (ref 13.0–17.7)
Immature Grans (Abs): 0 10*3/uL (ref 0.0–0.1)
Immature Granulocytes: 0 %
Lymphocytes Absolute: 2.6 10*3/uL (ref 0.7–3.1)
Lymphs: 26 %
MCH: 21.1 pg — ABNORMAL LOW (ref 26.6–33.0)
MCHC: 28.7 g/dL — ABNORMAL LOW (ref 31.5–35.7)
MCV: 74 fL — ABNORMAL LOW (ref 79–97)
Monocytes Absolute: 1 10*3/uL — ABNORMAL HIGH (ref 0.1–0.9)
Monocytes: 11 %
NRBC: 1 % — ABNORMAL HIGH (ref 0–0)
Neutrophils Absolute: 5.8 10*3/uL (ref 1.4–7.0)
Neutrophils: 58 %
Platelets: 674 10*3/uL — ABNORMAL HIGH (ref 150–450)
Potassium: 4.8 mmol/L (ref 3.5–5.2)
RBC: 3.98 x10E6/uL — ABNORMAL LOW (ref 4.14–5.80)
RDW: 24.2 % — ABNORMAL HIGH (ref 11.6–15.4)
Retic Ct Pct: 4.6 % — ABNORMAL HIGH (ref 0.6–2.6)
Sodium: 137 mmol/L (ref 134–144)
Total Protein: 8.2 g/dL (ref 6.0–8.5)
Vit D, 25-Hydroxy: 13.9 ng/mL — ABNORMAL LOW (ref 30.0–100.0)
WBC: 9.9 10*3/uL (ref 3.4–10.8)
eGFR: 130 mL/min/{1.73_m2} (ref >59)

## 2023-08-21 LAB — DRUG SCREEN 11 W/CONF, SE
Amphetamines, IA: NEGATIVE ng/mL
Barbiturates, IA: NEGATIVE ug/mL
Benzodiazepines, IA: NEGATIVE ng/mL
Cocaine & Metabolite, IA: NEGATIVE ng/mL
Ethyl Alcohol, Enz: NEGATIVE g/dL
Methadone, IA: NEGATIVE ng/mL
Opiates, IA: NEGATIVE ng/mL
Oxycodones, IA: NEGATIVE ng/mL
Phencyclidine, IA: NEGATIVE ng/mL
Propoxyphene, IA: NEGATIVE ng/mL
THC(Marijuana) Metabolite, IA: POSITIVE ng/mL — AB

## 2023-08-21 LAB — THC,MS,WB/SP RFX
Cannabidiol: NEGATIVE ng/mL
Cannabinoid Confirmation: POSITIVE
Carboxy-THC: 30.4 ng/mL
Hydroxy-THC: 1.6 ng/mL
Tetrahydrocannabinol(THC): 2.5 ng/mL

## 2023-08-21 LAB — RPR: RPR Ser Ql: NONREACTIVE

## 2023-08-21 LAB — HIV ANTIBODY (ROUTINE TESTING W REFLEX): HIV Screen 4th Generation wRfx: NONREACTIVE

## 2023-08-24 ENCOUNTER — Telehealth: Payer: Self-pay

## 2023-08-24 NOTE — Progress Notes (Signed)
  Medicaid Managed Care   Unsuccessful Attempt Note   08/24/2023 Name: Derrick Wu MRN: [id number] DOB: [birth year]  Referred by: Ivonne Andrew, NP Reason for referral : High Risk Managed Medicaid (MM Initial Outreach. )   An unsuccessful telephone outreach was attempted today. The patient was referred to the case management team for assistance with care management and care coordination.    Follow Up Plan: A HIPAA compliant phone message was left for the patient providing contact information and requesting a return call.    [facility]  [facility], [facility] Care Management Assistant Direct Dial: [phone]  Fax: [phone]

## 2023-08-25 ENCOUNTER — Telehealth: Payer: Self-pay

## 2023-08-25 NOTE — Progress Notes (Signed)
  Medicaid Managed Care   Unsuccessful Attempt Note   08/25/2023 Name: Derrick Wu MRN: [id number] DOB: [birth year]  Referred by: Ivonne Andrew, NP Reason for referral : High Risk Managed Medicaid (MM Initial Outreach for [facility]. )   A second unsuccessful telephone outreach was attempted today. The patient was referred to the case management team for assistance with care management and care coordination.    Follow Up Plan: A HIPAA compliant phone message was left for the patient providing contact information and requesting a return call.    [facility]  [facility], [facility] Care Management Assistant Direct Dial: [phone]  Fax: [phone]

## 2023-09-01 ENCOUNTER — Telehealth: Payer: Self-pay

## 2023-09-01 ENCOUNTER — Other Ambulatory Visit: Payer: Self-pay

## 2023-09-01 DIAGNOSIS — D571 Sickle-cell disease without crisis: Secondary | ICD-10-CM

## 2023-09-01 MED ORDER — OXYCODONE-ACETAMINOPHEN 10-325 MG PO TABS: 1.0000 | ORAL_TABLET | Freq: Three times a day (TID) | ORAL | 0 refills | Status: DC | PRN

## 2023-09-01 NOTE — Progress Notes (Signed)
..   Medicaid Managed Care   Unsuccessful Outreach Note  09/01/2023 Name: Derrick Wu MRN: [id number] DOB: August 12, 1995  Referred by: Ivonne Andrew, NP Reason for referral : High Risk Managed Medicaid   Third unsuccessful telephone outreach was attempted today. The patient was referred to the case management team for assistance with care management and care coordination. The patient's primary care provider has been notified of our unsuccessful attempts to make or maintain contact with the patient. The care management team is pleased to engage with this patient at any time in the future should he/she be interested in assistance from the care management team.   Follow Up Plan: We have been unable to make contact with the patient for follow up. The care management team is available to follow up with the patient after provider conversation with the patient regarding recommendation for care management engagement and subsequent re-referral to the care management team.   Weston Settle [facility], [facility] Guide Direct Dial: [phone]  Fax: [phone]

## 2023-09-01 NOTE — Telephone Encounter (Signed)
 Please advise [facility]

## 2023-09-14 ENCOUNTER — Other Ambulatory Visit: Payer: Self-pay | Admitting: Internal Medicine

## 2023-09-14 DIAGNOSIS — D571 Sickle-cell disease without crisis: Secondary | ICD-10-CM

## 2023-09-16 MED ORDER — OXYCODONE-ACETAMINOPHEN 10-325 MG PO TABS: 1.0000 | ORAL_TABLET | Freq: Three times a day (TID) | ORAL | 0 refills | Status: DC | PRN

## 2023-09-29 ENCOUNTER — Other Ambulatory Visit: Payer: Self-pay

## 2023-09-29 DIAGNOSIS — D571 Sickle-cell disease without crisis: Secondary | ICD-10-CM

## 2023-09-29 MED ORDER — OXYCODONE-ACETAMINOPHEN 10-325 MG PO TABS: 1.0000 | ORAL_TABLET | Freq: Three times a day (TID) | ORAL | 0 refills | Status: DC | PRN

## 2023-09-29 NOTE — Telephone Encounter (Signed)
 Please advise [facility]

## 2023-10-12 ENCOUNTER — Other Ambulatory Visit: Payer: Self-pay

## 2023-10-12 DIAGNOSIS — D571 Sickle-cell disease without crisis: Secondary | ICD-10-CM

## 2023-10-12 NOTE — Telephone Encounter (Signed)
 Please advise [facility]

## 2023-10-13 MED ORDER — OXYCODONE-ACETAMINOPHEN 10-325 MG PO TABS
1.0000 | ORAL_TABLET | Freq: Three times a day (TID) | ORAL | 0 refills | Status: DC | PRN
Start: 2023-10-13 — End: 2023-10-25

## 2023-10-25 ENCOUNTER — Other Ambulatory Visit: Payer: Self-pay

## 2023-10-25 DIAGNOSIS — D571 Sickle-cell disease without crisis: Secondary | ICD-10-CM

## 2023-10-25 MED ORDER — OXYCODONE-ACETAMINOPHEN 10-325 MG PO TABS: 1.0000 | ORAL_TABLET | Freq: Three times a day (TID) | ORAL | 0 refills | Status: DC | PRN

## 2023-10-25 NOTE — Telephone Encounter (Signed)
 Please advise [facility]

## 2023-11-11 ENCOUNTER — Ambulatory Visit: Payer: Self-pay | Admitting: Nurse Practitioner

## 2023-11-12 ENCOUNTER — Encounter: Payer: Self-pay | Admitting: Nurse Practitioner

## 2023-11-12 ENCOUNTER — Other Ambulatory Visit: Payer: Self-pay | Admitting: Nurse Practitioner

## 2023-11-12 ENCOUNTER — Ambulatory Visit: Payer: Self-pay | Admitting: Nurse Practitioner

## 2023-11-12 ENCOUNTER — Ambulatory Visit (INDEPENDENT_AMBULATORY_CARE_PROVIDER_SITE_OTHER): Payer: Self-pay | Admitting: Nurse Practitioner

## 2023-11-12 VITALS — BP 126/61 | HR 67 | Temp 97.2°F | Wt 175.0 lb

## 2023-11-12 DIAGNOSIS — D571 Sickle-cell disease without crisis: Secondary | ICD-10-CM

## 2023-11-12 DIAGNOSIS — E559 Vitamin D deficiency, unspecified: Secondary | ICD-10-CM | POA: Diagnosis not present

## 2023-11-12 NOTE — Patient Instructions (Addendum)

## 2023-11-12 NOTE — Assessment & Plan Note (Signed)
 Last vitamin D  Lab Results  Component Value Date   VD25OH 13.9 (L) 08/13/2023  Continue vitamin D  50,000 units once weekly

## 2023-11-12 NOTE — Telephone Encounter (Signed)
 Please advise [facility]

## 2023-11-12 NOTE — Assessment & Plan Note (Addendum)
 Sickle cell disease - Continue  Folic acid  1 mg daily.  We discussed the need for good hydration, monitoring of hydration status, avoidance of heat, cold, stress, and infection triggers.  The patient was reminded of the need to seek medical attention of any symptoms of bleeding, anemia, or infection.   Pulmonary evaluation - Patient denies shortness of breath, cough  Eye - High risk of proliferative retinopathy. Annual eye exam with retinal exam recommended to patient.  Patient referred to ophthalmology   Acute and chronic painful episodes - states that he primarily utilizes Percocet 10 325 mg 1 tablet every 8 hours as needed  for pain related to sickle cell anemia.  He was encouraged to take ibuprofen  800 mg 3 times daily as needed mild pain .We reviewed the terms of our pain agreement, including the need to keep medicines in a safe locked location away from children or pets, and the need to report excess sedation or constipation, measures to avoid constipation,   1. Sickle cell disease without crisis (HCC) (Primary)  - Ambulatory referral to Ophthalmology - Sickle Cell Panel - ToxAssure Flex 15, Ur  2. Vitamin D  deficiency  - Sickle Cell Panel

## 2023-11-12 NOTE — Telephone Encounter (Signed)
 Copied from CRM [id number]. Topic: Clinical - Medication Refill >> Nov 12, 2023 11:07 AM Ary Bitter R wrote: Medication:  folic acid  (FOLVITE ) 1 MG tablet oxyCODONE -acetaminophen  (PERCOCET) 10-325 MG tablet Vitamin D , Ergocalciferol , (DRISDOL ) 1.25 MG (50000 UNIT) CAPS capsule  Has the patient contacted their pharmacy? Yes  This is the patient's preferred pharmacy:  [facility] [phone] Jonette Nestle, [place] - [address] [place] [address] Phone: [phone] Fax: [phone]  Is this the correct pharmacy for this prescription? Yes If no, delete pharmacy and type the correct one.   Has the prescription been filled recently? Yes  Is the patient out of the medication? Yes  Has the patient been seen for an appointment in the last year OR does the patient have an upcoming appointment? Yes  Can we respond through MyChart? No  Agent: Please be advised that Rx refills may take up to 3 business days. We ask that you follow-up with your pharmacy.

## 2023-11-12 NOTE — Progress Notes (Signed)
 Established Patient Office Visit  Subjective:  Patient ID: Derrick Wu, male    DOB: 04-11-1996  Age: 28 y.o. MRN: [id number]  CC:  Chief Complaint  Patient presents with   Sickle Cell Anemia    HPI Derrick Wu is a 28 y.o. male  has a past medical history of Asthma, Headache(784.0), Pneumonia, Seasonal allergies, Sickle cell anemia (HCC), Vision abnormalities, and Vitamin D  deficiency (09/2020).   Patient presents for follow-up on his chronic medical conditions  Sickle cell disease.  Currently on folic acid  1 mg daily.  Rarely has sickle cell crisis, for chronic pain he takes Percocet 10 325 mg 1 tablet every 8 hours as needed, has a prescription for ibuprofen  but does not take the medication.  His pain level is currently rated 4/10, his sickle cell pain is usually in his feet and knees.  Percocet was last taken 2 days ago.  He currently denies fever, chills, chest pain, shortness of breath, abdominal pain, nausea, vomiting    Past Medical History:  Diagnosis Date   Asthma    Headache(784.0)    Pneumonia    Seasonal allergies    Sickle cell anemia (HCC)    Vision abnormalities    Hx: of wears glasses   Vitamin D  deficiency 09/2020    Past Surgical History:  Procedure Laterality Date   TOOTH EXTRACTION N/A 05/26/2013   Procedure: EXTRACTION WISDOM TEETH - one, sixteen, seventeen and thirtytwo.;  Surgeon: Cornelia Dieter, DDS;  Location: [facility] OR;  Service: Oral Surgery;  Laterality: N/A;    Family History  Problem Relation Age of Onset   Diabetes Mother    Hypertension Mother    Vision loss Mother    Diabetes Father    Hypertension Father    Vision loss Father     Social History   Socioeconomic History   Marital status: Single    Spouse name: Not on file   Number of children: Not on file   Years of education: Not on file   Highest education level: Not on file  Occupational History   Not on file  Tobacco Use   Smoking status: Never    Smokeless tobacco: Never  Vaping Use   Vaping status: Never Used  Substance and Sexual Activity   Alcohol use: No   Drug use: No   Sexual activity: Yes    Birth control/protection: Condom  Other Topics Concern   Not on file  Social History Narrative   Pt is in 11th grade 2014-15   Social Drivers of Health   Financial Resource Strain: Not on file  Food Insecurity: Not on file  Transportation Needs: Not on file  Physical Activity: Not on file  Stress: Not on file  Social Connections: Not on file  Intimate Partner Violence: Not on file    Outpatient Medications Prior to Visit  Medication Sig Dispense Refill   folic acid  (FOLVITE ) 1 MG tablet TAKE 1 TABLET BY MOUTH EVERY DAY 90 tablet 3   oxyCODONE -acetaminophen  (PERCOCET) 10-325 MG tablet Take 1 tablet by mouth every 8 (eight) hours as needed for pain. Take 1 tablet by mouth every 8 hours as needed for 15 days. 30 tablet 0   QUERCETIN PO Take by mouth.     Vitamin D , Ergocalciferol , (DRISDOL ) 1.25 MG (50000 UNIT) CAPS capsule Take 1 capsule (50,000 Units total) by mouth every 7 (seven) days. 5 capsule 5   cetirizine  (ZYRTEC ) 10 MG tablet TAKE 1 TABLET BY MOUTH EVERY  DAY (Patient not taking: Reported on 11/19/2022) 30 tablet 2   fexofenadine  (ALLEGRA  ALLERGY) 180 MG tablet Take 1 tablet (180 mg total) by mouth daily. (Patient not taking: Reported on 11/12/2023) 90 tablet 2   ibuprofen  (ADVIL ) 800 MG tablet Take 1 tablet (800 mg total) by mouth every 8 (eight) hours as needed. (Patient not taking: Reported on 11/12/2023) 30 tablet 6   No facility-administered medications prior to visit.    Allergies  Allergen Reactions   Mushroom Extract Complex (Obsolete) Nausea And Vomiting    ROS Review of Systems  Constitutional:  Negative for appetite change, chills, fatigue and fever.  HENT:  Negative for congestion, postnasal drip, rhinorrhea and sneezing.   Respiratory:  Negative for cough, shortness of breath and wheezing.    Cardiovascular:  Negative for chest pain, palpitations and leg swelling.  Gastrointestinal:  Negative for abdominal pain, constipation, nausea and vomiting.  Genitourinary:  Negative for difficulty urinating, dysuria, flank pain and frequency.  Musculoskeletal:  Positive for arthralgias. Negative for back pain, joint swelling and myalgias.  Skin:  Negative for color change, pallor, rash and wound.  Neurological:  Negative for dizziness, facial asymmetry, weakness, numbness and headaches.  Psychiatric/Behavioral:  Negative for behavioral problems, confusion, self-injury and suicidal ideas.       Objective:     Physical Exam Vitals and nursing note reviewed.  Constitutional:      General: He is not in acute distress.    Appearance: Normal appearance. He is not ill-appearing, toxic-appearing or diaphoretic.  Eyes:     General: No scleral icterus.       Right eye: No discharge.        Left eye: No discharge.     Extraocular Movements: Extraocular movements intact.     Conjunctiva/sclera: Conjunctivae normal.  Cardiovascular:     Rate and Rhythm: Normal rate and regular rhythm.     Pulses: Normal pulses.     Heart sounds: Normal heart sounds. No murmur heard.    No friction rub. No gallop.  Pulmonary:     Effort: Pulmonary effort is normal. No respiratory distress.     Breath sounds: Normal breath sounds. No stridor. No wheezing, rhonchi or rales.  Chest:     Chest wall: No tenderness.  Abdominal:     General: There is no distension.     Palpations: Abdomen is soft.     Tenderness: There is no abdominal tenderness. There is no right CVA tenderness, left CVA tenderness or guarding.  Musculoskeletal:        General: No swelling or signs of injury.     Right lower leg: No edema.     Left lower leg: No edema.  Skin:    General: Skin is warm and dry.     Capillary Refill: Capillary refill takes less than 2 seconds.     Coloration: Skin is not jaundiced or pale.     Findings: No  bruising, erythema or lesion.  Neurological:     Mental Status: He is alert and oriented to person, place, and time.     Motor: No weakness.     Coordination: Coordination normal.     Gait: Gait normal.  Psychiatric:        Mood and Affect: Mood normal.        Behavior: Behavior normal.        Thought Content: Thought content normal.        Judgment: Judgment normal.     BP 126/61  Pulse 67   Temp (!) 97.2 F (36.2 C)   Wt 175 lb (79.4 kg)   SpO2 99%   BMI 23.09 kg/m  Wt Readings from Last 3 Encounters:  11/12/23 175 lb (79.4 kg)  08/13/23 176 lb 3.2 oz (79.9 kg)  02/19/23 166 lb (75.3 kg)    No results found for: "TSH" Lab Results  Component Value Date   WBC 9.9 08/13/2023   HGB 8.4 (L) 08/13/2023   HCT 29.3 (L) 08/13/2023   MCV 74 (L) 08/13/2023   PLT 674 (H) 08/13/2023   Lab Results  Component Value Date   NA 137 08/13/2023   K 4.8 08/13/2023   CO2 23 08/13/2023   GLUCOSE 80 08/13/2023   BUN 8 08/13/2023   CREATININE 0.70 (L) 08/13/2023   BILITOT 1.0 08/13/2023   ALKPHOS 110 08/13/2023   AST 45 (H) 08/13/2023   ALT 38 08/13/2023   PROT 8.2 08/13/2023   ALBUMIN 4.7 08/13/2023   CALCIUM 9.8 08/13/2023   EGFR 130 08/13/2023   Lab Results  Component Value Date   CHOL 162 11/19/2022   Lab Results  Component Value Date   HDL 35 (L) 11/19/2022   Lab Results  Component Value Date   LDLCALC 115 (H) 11/19/2022   Lab Results  Component Value Date   TRIG 63 11/19/2022   Lab Results  Component Value Date   CHOLHDL 4.6 11/19/2022   No results found for: "HGBA1C"    Assessment & Plan:   Problem List Items Addressed This Visit       Other   Sickle cell anemia (HCC) - Primary   Sickle cell disease - Continue  Folic acid  1 mg daily.  We discussed the need for good hydration, monitoring of hydration status, avoidance of heat, cold, stress, and infection triggers.  The patient was reminded of the need to seek medical attention of any symptoms of  bleeding, anemia, or infection.   Pulmonary evaluation - Patient denies shortness of breath, cough  Eye - High risk of proliferative retinopathy. Annual eye exam with retinal exam recommended to patient.  Patient referred to ophthalmology   Acute and chronic painful episodes - states that he primarily utilizes Percocet 10 325 mg 1 tablet every 8 hours as needed  for pain related to sickle cell anemia.  He was encouraged to take ibuprofen  800 mg 3 times daily as needed mild pain .We reviewed the terms of our pain agreement, including the need to keep medicines in a safe locked location away from children or pets, and the need to report excess sedation or constipation, measures to avoid constipation,   1. Sickle cell disease without crisis (HCC) (Primary)  - Ambulatory referral to Ophthalmology - Sickle Cell Panel - ToxAssure Flex 15, Ur  2. Vitamin D  deficiency  - Sickle Cell Panel          Relevant Orders   Ambulatory referral to Ophthalmology   Sickle Cell Panel   ToxAssure Flex 15, Ur   Vitamin D  deficiency   Last vitamin D  Lab Results  Component Value Date   VD25OH 13.9 (L) 08/13/2023  Continue vitamin D  50,000 units once weekly      Relevant Orders   Sickle Cell Panel    No orders of the defined types were placed in this encounter.   Follow-up: Return in about 3 months (around 02/12/2024).    Naod Sweetland R Finnis Colee, FNP

## 2023-11-13 LAB — CMP14+CBC/D/PLT+FER+RETIC+V...
ALT: 76 IU/L — ABNORMAL HIGH (ref 0–44)
AST: 73 IU/L — ABNORMAL HIGH (ref 0–40)
Albumin: 4.5 g/dL (ref 4.3–5.2)
Alkaline Phosphatase: 109 IU/L (ref 44–121)
BUN/Creatinine Ratio: 8 — ABNORMAL LOW (ref 9–20)
BUN: 6 mg/dL (ref 6–20)
Basophils Absolute: 0.1 10*3/uL (ref 0.0–0.2)
Basos: 2 %
Bilirubin Total: 0.7 mg/dL (ref 0.0–1.2)
CO2: 21 mmol/L (ref 20–29)
Calcium: 9.6 mg/dL (ref 8.7–10.2)
Chloride: 99 mmol/L (ref 96–106)
Creatinine, Ser: 0.71 mg/dL — ABNORMAL LOW (ref 0.76–1.27)
EOS (ABSOLUTE): 0.3 10*3/uL (ref 0.0–0.4)
Eos: 4 %
Ferritin: 35 ng/mL (ref 30–400)
Globulin, Total: 3.3 g/dL (ref 1.5–4.5)
Glucose: 92 mg/dL (ref 70–99)
Hematocrit: 30.6 % — ABNORMAL LOW (ref 37.5–51.0)
Hemoglobin: 8.7 g/dL — ABNORMAL LOW (ref 13.0–17.7)
Immature Grans (Abs): 0 10*3/uL (ref 0.0–0.1)
Immature Granulocytes: 0 %
Lymphocytes Absolute: 2.7 10*3/uL (ref 0.7–3.1)
Lymphs: 37 %
MCH: 21.3 pg — ABNORMAL LOW (ref 26.6–33.0)
MCHC: 28.4 g/dL — ABNORMAL LOW (ref 31.5–35.7)
MCV: 75 fL — ABNORMAL LOW (ref 79–97)
Monocytes Absolute: 0.8 10*3/uL (ref 0.1–0.9)
Monocytes: 11 %
NRBC: 1 % — ABNORMAL HIGH (ref 0–0)
Neutrophils Absolute: 3.3 10*3/uL (ref 1.4–7.0)
Neutrophils: 46 %
Platelets: 681 10*3/uL — ABNORMAL HIGH (ref 150–450)
Potassium: 4.6 mmol/L (ref 3.5–5.2)
RBC: 4.08 x10E6/uL — ABNORMAL LOW (ref 4.14–5.80)
RDW: 23.1 % — ABNORMAL HIGH (ref 11.6–15.4)
Retic Ct Pct: 5.7 % — ABNORMAL HIGH (ref 0.6–2.6)
Sodium: 133 mmol/L — ABNORMAL LOW (ref 134–144)
Total Protein: 7.8 g/dL (ref 6.0–8.5)
Vit D, 25-Hydroxy: 18.3 ng/mL — ABNORMAL LOW (ref 30.0–100.0)
WBC: 7.2 10*3/uL (ref 3.4–10.8)
eGFR: 129 mL/min/{1.73_m2} (ref >59)

## 2023-11-15 ENCOUNTER — Other Ambulatory Visit: Payer: Self-pay | Admitting: Nurse Practitioner

## 2023-11-15 ENCOUNTER — Telehealth: Payer: Self-pay | Admitting: Nurse Practitioner

## 2023-11-15 ENCOUNTER — Ambulatory Visit: Payer: Self-pay | Admitting: Nurse Practitioner

## 2023-11-15 DIAGNOSIS — E559 Vitamin D deficiency, unspecified: Secondary | ICD-10-CM

## 2023-11-15 MED ORDER — OXYCODONE-ACETAMINOPHEN 10-325 MG PO TABS: 1.0000 | ORAL_TABLET | Freq: Three times a day (TID) | ORAL | 0 refills | Status: DC | PRN

## 2023-11-15 MED ORDER — VITAMIN D (ERGOCALCIFEROL) 1.25 MG (50000 UNIT) PO CAPS: 50000.0000 [IU] | ORAL_CAPSULE | ORAL | 5 refills | Status: DC

## 2023-11-15 MED ORDER — FOLIC ACID 1 MG PO TABS: 1.0000 mg | ORAL_TABLET | Freq: Every day | ORAL | 3 refills | Status: DC

## 2023-11-15 NOTE — Telephone Encounter (Signed)
 Copied from CRM [id number]. Topic: Referral - Status >> Nov 12, 2023 11:24 AM Baldemar Lev wrote: Reason for CRM: Ardelia Beau from [facility] called to report that they are no longer in network with Medicaid  Best contact: [phone]

## 2023-11-16 ENCOUNTER — Other Ambulatory Visit: Payer: Self-pay | Admitting: Nurse Practitioner

## 2023-11-16 DIAGNOSIS — D571 Sickle-cell disease without crisis: Secondary | ICD-10-CM

## 2023-11-16 LAB — TOXASSURE FLEX 15, UR
6-ACETYLMORPHINE IA: NEGATIVE ng/mL
AMPHETAMINES IA: NEGATIVE ng/mL
BARBITURATES IA: NEGATIVE ng/mL
COCAINE METABOLITE IA: NEGATIVE ng/mL
Creatinine: 99 mg/dL (ref >=20)
ETHYL ALCOHOL Enzymatic: NEGATIVE g/dL
METHADONE IA: NEGATIVE ng/mL
METHADONE MTB IA: NEGATIVE ng/mL
OPIATE CLASS IA: NEGATIVE ng/mL
OXYCODONE CLASS IA: NEGATIVE ng/mL
PHENCYCLIDINE IA: NEGATIVE ng/mL
TAPENTADOL, IA: NEGATIVE ng/mL
TRAMADOL IA: NEGATIVE ng/mL

## 2023-11-16 LAB — CANNABINOIDS, MS, UR RFX
Cannabinoids Confirmation: POSITIVE
Carboxy-THC: 96 ng/mg{creat}

## 2023-11-23 ENCOUNTER — Telehealth: Payer: Self-pay | Admitting: Nurse Practitioner

## 2023-11-23 NOTE — Telephone Encounter (Signed)
 Copied from CRM [id number]. Topic: Referral - Question >> Nov 23, 2023  3:02 PM Georgeann Kindred wrote: Reason for CRM: [facility] the [facility]) called stating that there is a discrepancy in which the provider is referring the patient to. In addition, patient has Medicaid and they do not accept Medicaid. Please contact Aleen Ammons at [phone] and press [phone] for the front desk for additional information.

## 2023-11-23 NOTE — Telephone Encounter (Signed)
 Referral faxed to [facility].  Patient may contact his insurance to find a provider in network.  Thank you

## 2023-11-30 ENCOUNTER — Other Ambulatory Visit: Payer: Self-pay | Admitting: Nurse Practitioner

## 2023-11-30 DIAGNOSIS — D571 Sickle-cell disease without crisis: Secondary | ICD-10-CM

## 2023-11-30 NOTE — Telephone Encounter (Signed)
 Please advise [facility]

## 2023-11-30 NOTE — Telephone Encounter (Signed)
 Done [facility]

## 2023-11-30 NOTE — Telephone Encounter (Signed)
 Copied from CRM [id number]. Topic: Clinical - Medication Refill >> Nov 30, 2023  8:41 AM Everette C wrote: Medication: oxyCODONE -acetaminophen  (PERCOCET) 10-325 MG tablet   Has the patient contacted their pharmacy? Yes (Agent: If no, request that the patient contact the pharmacy for the refill. If patient does not wish to contact the pharmacy document the reason why and proceed with request.) (Agent: If yes, when and what did the pharmacy advise?)  This is the patient's preferred pharmacy:  [phone], [place] - [address] [place] [address] Phone: [phone] Fax: [phone]   Is this the correct pharmacy for this prescription? Yes If no, delete pharmacy and type the correct one.   Has the prescription been filled recently? Yes  Is the patient out of the medication? Yes  Has the patient been seen for an appointment in the last year OR does the patient have an upcoming appointment? No  Can we respond through MyChart? No  Agent: Please be advised that Rx refills may take up to 3 business days. We ask that you follow-up with your pharmacy.

## 2023-12-03 ENCOUNTER — Other Ambulatory Visit: Payer: Self-pay

## 2023-12-03 DIAGNOSIS — D571 Sickle-cell disease without crisis: Secondary | ICD-10-CM

## 2023-12-03 NOTE — Telephone Encounter (Signed)
 Copied from CRM [id number]. Topic: Clinical - Medication Question >> Dec 03, 2023 10:28 AM Rosaria Common wrote: Reason for CRM: Pt calling to f/u on status of medication refill request submitted 11/30/2023. Called clinic with no answer. CRM sent.  Please advise [facility]

## 2023-12-05 MED ORDER — OXYCODONE-ACETAMINOPHEN 10-325 MG PO TABS: 1.0000 | ORAL_TABLET | Freq: Three times a day (TID) | ORAL | 0 refills | Status: DC | PRN

## 2023-12-20 ENCOUNTER — Other Ambulatory Visit: Payer: Self-pay | Admitting: Nurse Practitioner

## 2023-12-20 DIAGNOSIS — D571 Sickle-cell disease without crisis: Secondary | ICD-10-CM

## 2023-12-20 NOTE — Telephone Encounter (Signed)
 12/05/23 30 tabs/0 RF (10 day)

## 2023-12-20 NOTE — Telephone Encounter (Unsigned)
 Copied from CRM [id number]. Topic: Clinical - Medication Refill >> Dec 20, 2023 12:44 PM Delon T wrote: Medication: oxyCODONE -acetaminophen  (PERCOCET) 10-325 MG tablet  Has the patient contacted their pharmacy? No (Agent: If no, request that the patient contact the pharmacy for the refill. If patient does not wish to contact the pharmacy document the reason why and proceed with request.) (Agent: If yes, when and what did the pharmacy advise?)  This is the patient's preferred pharmacy:  [phone], [place] - [address] [place] [address] Phone: [phone] Fax: [phone]  Is this the correct pharmacy for this prescription? Yes If no, delete pharmacy and type the correct one.   Has the prescription been filled recently? Yes  Is the patient out of the medication? Yes  Has the patient been seen for an appointment in the last year OR does the patient have an upcoming appointment? Yes  Can we respond through MyChart? Yes  Agent: Please be advised that Rx refills may take up to 3 business days. We ask that you follow-up with your pharmacy.

## 2023-12-22 MED ORDER — OXYCODONE-ACETAMINOPHEN 10-325 MG PO TABS: 1.0000 | ORAL_TABLET | Freq: Three times a day (TID) | ORAL | 0 refills | Status: DC | PRN

## 2024-01-04 ENCOUNTER — Other Ambulatory Visit: Payer: Self-pay | Admitting: Nurse Practitioner

## 2024-01-04 DIAGNOSIS — D571 Sickle-cell disease without crisis: Secondary | ICD-10-CM

## 2024-01-04 NOTE — Telephone Encounter (Signed)
 Last OV 11/12/23

## 2024-01-04 NOTE — Telephone Encounter (Unsigned)
 Copied from CRM [id number]. Topic: Clinical - Medication Refill >> Jan 04, 2024  1:03 PM Derrick Wu wrote: Medication: oxyCODONE -acetaminophen  (PERCOCET) 10-325 MG tablet  Has the patient contacted their pharmacy? Yes (Agent: If no, request that the patient contact the pharmacy for the refill. If patient does not wish to contact the pharmacy document the reason why and proceed with request.) (Agent: If yes, when and what did the pharmacy advise?)  This is the patient's preferred pharmacy:  [phone], [place] - [address] [place] [address] Phone: [phone] Fax: [phone]  Is this the correct pharmacy for this prescription? Yes If no, delete pharmacy and type the correct one.   Has the prescription been filled recently? Yes  Is the patient out of the medication? Yes  Has the patient been seen for an appointment in the last year OR does the patient have an upcoming appointment? Yes  Can we respond through MyChart? Yes  Agent: Please be advised that Rx refills may take up to 3 business days. We ask that you follow-up with your pharmacy.

## 2024-01-05 NOTE — Telephone Encounter (Signed)
 Not a patient at our office, [facility].

## 2024-01-07 ENCOUNTER — Other Ambulatory Visit: Payer: Self-pay | Admitting: Nurse Practitioner

## 2024-01-07 DIAGNOSIS — D571 Sickle-cell disease without crisis: Secondary | ICD-10-CM

## 2024-01-07 MED ORDER — OXYCODONE-ACETAMINOPHEN 10-325 MG PO TABS: 1.0000 | ORAL_TABLET | Freq: Three times a day (TID) | ORAL | 0 refills | Status: DC | PRN

## 2024-01-07 NOTE — Telephone Encounter (Signed)
 Copied from CRM [id number]. Topic: Clinical - Medication Refill >> Jan 07, 2024 12:23 PM Carlatta H wrote: Medication: oxyCODONE -acetaminophen  (PERCOCET) 10-325 MG tablet  Has the patient contacted their pharmacy? No (Agent: If no, request that the patient contact the pharmacy for the refill. If patient does not wish to contact the pharmacy document the reason why and proceed with request.) (Agent: If yes, when and what did the pharmacy advise?)  This is the patient's preferred pharmacy:  [facility] [phone], [place] - [address] [place] [address] Phone: [phone] Fax: [phone]   Is this the correct pharmacy for this prescription? Yes If no, delete pharmacy and type the correct one.   Has the prescription been filled recently? No  Is the patient out of the medication? Yes  Has the patient been seen for an appointment in the last year OR does the patient have an upcoming appointment? Yes  Can we respond through MyChart? No  Agent: Please be advised that Rx refills may take up to 3 business days. We ask that you follow-up with your pharmacy.

## 2024-01-07 NOTE — Telephone Encounter (Signed)
 Please advise [facility]

## 2024-01-21 ENCOUNTER — Telehealth: Payer: Self-pay | Admitting: Nurse Practitioner

## 2024-01-21 NOTE — Telephone Encounter (Signed)
 Wrong office

## 2024-01-21 NOTE — Telephone Encounter (Signed)
 Copied from CRM [id number]. Topic: Clinical - Medication Refill >> Jan 21, 2024 11:01 AM Edsel HERO wrote: Medication: oxyCODONE -acetaminophen  (PERCOCET) 10-325 MG tablet  Has the patient contacted their pharmacy? No  This is the patient's preferred pharmacy:  [facility] [phone], [place] - [address] [place] [address] Phone: [phone] Fax: [phone]  Is this the correct pharmacy for this prescription? Yes  Has the prescription been filled recently? Yes  Is the patient out of the medication? Yes  Has the patient been seen for an appointment in the last year OR does the patient have an upcoming appointment? Yes  Can we respond through MyChart? Yes

## 2024-01-25 ENCOUNTER — Other Ambulatory Visit: Payer: Self-pay | Admitting: Nurse Practitioner

## 2024-01-25 DIAGNOSIS — D571 Sickle-cell disease without crisis: Secondary | ICD-10-CM

## 2024-01-25 MED ORDER — OXYCODONE-ACETAMINOPHEN 10-325 MG PO TABS: 1.0000 | ORAL_TABLET | Freq: Three times a day (TID) | ORAL | 0 refills | Status: DC | PRN

## 2024-01-25 NOTE — Telephone Encounter (Signed)
 Copied from CRM [id number]. Topic: Clinical - Medication Refill >> Jan 25, 2024 12:08 PM Tiffini S wrote: Medication: oxyCODONE -acetaminophen  (PERCOCET) 10-325 MG tablet  Has the patient contacted their pharmacy? Yes (Agent: If no, request that the patient contact the pharmacy for the refill. If patient does not wish to contact the pharmacy document the reason why and proceed with request.) (Agent: If yes, when and what did the pharmacy advise?)  This is the patient's preferred pharmacy:  [facility] [phone], [place] - [address] [place] [address] Phone: [phone] Fax: [phone]  Is this the correct pharmacy for this prescription? Yes If no, delete pharmacy and type the correct one.   Has the prescription been filled recently? Yes  Is the patient out of the medication? Yes  Has the patient been seen for an appointment in the last year OR does the patient have an upcoming appointment? Yes  Can we respond through MyChart? Yes and call the patient please at [phone]  Agent: Please be advised that Rx refills may take up to 3 business days. We ask that you follow-up with your pharmacy.

## 2024-02-08 ENCOUNTER — Other Ambulatory Visit: Payer: Self-pay | Admitting: Nurse Practitioner

## 2024-02-08 DIAGNOSIS — D571 Sickle-cell disease without crisis: Secondary | ICD-10-CM

## 2024-02-08 NOTE — Telephone Encounter (Unsigned)
 Copied from CRM [id number]. Topic: Clinical - Medication Refill >> Feb 08, 2024  5:32 PM Kevelyn M wrote: Medication: oxyCODONE -acetaminophen  (PERCOCET) 10-325 MG tablet  Has the patient contacted their pharmacy? No (Agent: If no, request that the patient contact the pharmacy for the refill. If patient does not wish to contact the pharmacy document the reason why and proceed with request.) (Agent: If yes, when and what did the pharmacy advise?)  This is the patient's preferred pharmacy:  [phone], [place] - [address] [place] [address] Phone: [phone] Fax: [phone]  Is this the correct pharmacy for this prescription? Yes If no, delete pharmacy and type the correct one.   Has the prescription been filled recently? Yes  Is the patient out of the medication? Yes  Has the patient been seen for an appointment in the last year OR does the patient have an upcoming appointment? Yes  Can we respond through MyChart? Yes  Agent: Please be advised that Rx refills may take up to 3 business days. We ask that you follow-up with your pharmacy.

## 2024-02-09 MED ORDER — OXYCODONE-ACETAMINOPHEN 10-325 MG PO TABS: 1.0000 | ORAL_TABLET | Freq: Three times a day (TID) | ORAL | 0 refills | Status: DC | PRN

## 2024-02-09 NOTE — Telephone Encounter (Signed)
 Please fill later  today. Thank you . KH

## 2024-02-15 ENCOUNTER — Other Ambulatory Visit: Payer: Self-pay | Admitting: Nurse Practitioner

## 2024-02-15 DIAGNOSIS — D571 Sickle-cell disease without crisis: Secondary | ICD-10-CM

## 2024-02-15 NOTE — Telephone Encounter (Unsigned)
 Copied from CRM [id number]. Topic: Clinical - Medication Refill >> Feb 15, 2024  1:24 PM [place] R wrote: Medication: oxyCODONE -acetaminophen  (PERCOCET) 10-325 MG tablet  Has the patient contacted their pharmacy? No controlled substance This is the patient's preferred pharmacy:  [phone], [place] - [address] [place] [address] [place] [address] Phone: [phone] Fax: [phone]    Is this the correct pharmacy for this prescription? Yes If no, delete pharmacy and type the correct one.   Has the prescription been filled recently? Yes  Is the patient out of the medication? Yes  Has the patient been seen for an appointment in the last year OR does the patient have an upcoming appointment? Yes  Can we respond through MyChart? Yes  Agent: Please be advised that Rx refills may take up to 3 business days. We ask that you follow-up with your pharmacy.

## 2024-02-16 MED ORDER — OXYCODONE-ACETAMINOPHEN 10-325 MG PO TABS: 1.0000 | ORAL_TABLET | Freq: Three times a day (TID) | ORAL | 0 refills | Status: DC | PRN

## 2024-02-18 ENCOUNTER — Ambulatory Visit (INDEPENDENT_AMBULATORY_CARE_PROVIDER_SITE_OTHER): Payer: Self-pay | Admitting: Nurse Practitioner

## 2024-02-18 ENCOUNTER — Encounter: Payer: Self-pay | Admitting: Nurse Practitioner

## 2024-02-18 VITALS — BP 118/71 | HR 83 | Temp 98.1°F | Wt 175.0 lb

## 2024-02-18 DIAGNOSIS — D571 Sickle-cell disease without crisis: Secondary | ICD-10-CM | POA: Diagnosis not present

## 2024-02-18 DIAGNOSIS — R079 Chest pain, unspecified: Secondary | ICD-10-CM

## 2024-02-18 DIAGNOSIS — Z113 Encounter for screening for infections with a predominantly sexual mode of transmission: Secondary | ICD-10-CM

## 2024-02-18 DIAGNOSIS — M79671 Pain in right foot: Secondary | ICD-10-CM | POA: Diagnosis not present

## 2024-02-18 DIAGNOSIS — M79672 Pain in left foot: Secondary | ICD-10-CM

## 2024-02-18 MED ORDER — FOLIC ACID 1 MG PO TABS: 1.0000 mg | ORAL_TABLET | Freq: Every day | ORAL | 3 refills | Status: AC

## 2024-02-18 MED ORDER — OXYCODONE-ACETAMINOPHEN 10-325 MG PO TABS: 1.0000 | ORAL_TABLET | Freq: Three times a day (TID) | ORAL | 0 refills | Status: DC | PRN

## 2024-02-18 MED ORDER — VITAMIN B-12 1000 MCG PO TABS: 1000.0000 ug | ORAL_TABLET | Freq: Every day | ORAL | 1 refills | Status: DC

## 2024-02-18 NOTE — Progress Notes (Signed)
 Subjective   Patient ID: Derrick Wu, male    DOB: 01/21/1996, 28 y.o.   MRN: [id number]  Chief Complaint  Patient presents with   Medical Management of Chronic Issues    Referring provider: Oley Bascom RAMAN, NP  Derrick Wu is a 28 y.o. male with Past Medical History: No date: Asthma No date: Headache(784.0) No date: Pneumonia No date: Seasonal allergies No date: Sickle cell anemia (HCC) No date: Vision abnormalities     Comment:  Hx: of wears glasses 09/2020: Vitamin D  deficiency   HPI  28 year old male with history of vitamin D  deficiency sickle cell Hb-SS disease, chronic pain, seasonal allergies.   Patient presents today for sickle cell follow-up.  He is requesting STD testing today. 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 Overall doing well. Last pain med was this past Monday. Denies f/c/s, n/v/d, hemoptysis, PND, leg swelling. Denies chest pain or edema.   Note: Patient complains today of some intermittent chest pain.  EKG in office did show left ventricle hypertrophy.  Will place referral to cardiology for further evaluation.    Allergies  Allergen Reactions   Mushroom Extract Complex (Obsolete) Nausea And Vomiting    Immunization History  Administered Date(s) Administered   Meningococcal Conjugate 02/22/2007, 11/05/2010   Pneumococcal Polysaccharide-23 11/23/2001, 02/22/2007, 01/08/2016   Tdap 01/27/2022    Tobacco History: Social History   Tobacco Use  Smoking Status Never  Smokeless Tobacco Never   Counseling given: Not Answered   Outpatient Encounter Medications as of 02/18/2024  Medication Sig   folic acid  (FOLVITE ) 1 MG tablet Take 1 tablet (1 mg total) by mouth daily.   oxyCODONE -acetaminophen  (PERCOCET) 10-325 MG tablet Take 1 tablet by mouth every 8 (eight) hours as needed for pain.   QUERCETIN PO Take by mouth.   Vitamin D , Ergocalciferol , (DRISDOL ) 1.25 MG (50000 UNIT) CAPS  capsule Take 1 capsule (50,000 Units total) by mouth every 7 (seven) days.   cetirizine  (ZYRTEC ) 10 MG tablet TAKE 1 TABLET BY MOUTH EVERY DAY (Patient not taking: Reported on 11/19/2022)   fexofenadine  (ALLEGRA  ALLERGY) 180 MG tablet Take 1 tablet (180 mg total) by mouth daily. (Patient not taking: Reported on 11/12/2023)   ibuprofen  (ADVIL ) 800 MG tablet Take 1 tablet (800 mg total) by mouth every 8 (eight) hours as needed. (Patient not taking: Reported on 11/12/2023)   No facility-administered encounter medications on file as of 02/18/2024.    Review of Systems  Review of Systems  Constitutional: Negative.   HENT: Negative.    Cardiovascular: Negative.   Gastrointestinal: Negative.   Allergic/Immunologic: Negative.   Neurological: Negative.   Psychiatric/Behavioral: Negative.       Objective:   BP 118/71   Pulse 83   Temp 98.1 F (36.7 C) (Oral)   Wt 175 lb (79.4 kg)   SpO2 97%   BMI 23.09 kg/m   Wt Readings from Last 5 Encounters:  02/18/24 175 lb (79.4 kg)  11/12/23 175 lb (79.4 kg)  08/13/23 176 lb 3.2 oz (79.9 kg)  02/19/23 166 lb (75.3 kg)  12/23/22 164 lb 6.4 oz (74.6 kg)     Physical Exam Vitals and nursing note reviewed.  Constitutional:      General: He is not in acute distress.    Appearance: He is well-developed.  Cardiovascular:     Rate and Rhythm: Normal rate and regular rhythm.  Pulmonary:     Effort: Pulmonary effort is normal.  Breath sounds: Normal breath sounds.  Skin:    General: Skin is warm and dry.  Neurological:     Mental Status: He is alert and oriented to person, place, and time.       Assessment & Plan:   Sickle cell disease without crisis (HCC) -     Sickle Cell Panel -     ToxAssure Flex 15, Ur  Screen for STD (sexually transmitted disease) -     Chlamydia/Gonococcus/Trichomonas, NAA -     RPR+HIV+GC+CT Panel  Intermittent chest pain -     EKG 12-Lead  Bilateral foot pain -     Ambulatory referral to Podiatry      Return in about 3 months (around 05/19/2024).     [name], NP 02/18/2024

## 2024-02-18 NOTE — Addendum Note (Signed)
 Addended by: OLEY BASCOM RAMAN on: 02/18/2024 11:57 AM   Modules accepted: Orders

## 2024-02-20 LAB — CMP14+CBC/D/PLT+FER+RETIC+V...
ALT: 32 IU/L (ref 0–44)
AST: 36 IU/L (ref 0–40)
Albumin: 4.7 g/dL (ref 4.3–5.2)
Alkaline Phosphatase: 103 IU/L (ref 44–121)
BUN/Creatinine Ratio: 11 (ref 9–20)
BUN: 8 mg/dL (ref 6–20)
Basophils Absolute: 0.1 x10E3/uL (ref 0.0–0.2)
Basos: 1 %
Bilirubin Total: 1.1 mg/dL (ref 0.0–1.2)
CO2: 20 mmol/L (ref 20–29)
Calcium: 9.7 mg/dL (ref 8.7–10.2)
Chloride: 100 mmol/L (ref 96–106)
Creatinine, Ser: 0.76 mg/dL (ref 0.76–1.27)
EOS (ABSOLUTE): 0.3 x10E3/uL (ref 0.0–0.4)
Eos: 3 %
Ferritin: 28 ng/mL — ABNORMAL LOW (ref 30–400)
Globulin, Total: 3.6 g/dL (ref 1.5–4.5)
Glucose: 77 mg/dL (ref 70–99)
Hematocrit: 30 % — ABNORMAL LOW (ref 37.5–51.0)
Hemoglobin: 8.6 g/dL — ABNORMAL LOW (ref 13.0–17.7)
Immature Grans (Abs): 0 x10E3/uL (ref 0.0–0.1)
Immature Granulocytes: 0 %
Lymphocytes Absolute: 3.5 x10E3/uL — ABNORMAL HIGH (ref 0.7–3.1)
Lymphs: 39 %
MCH: 22.1 pg — ABNORMAL LOW (ref 26.6–33.0)
MCHC: 28.7 g/dL — ABNORMAL LOW (ref 31.5–35.7)
MCV: 77 fL — ABNORMAL LOW (ref 79–97)
Monocytes Absolute: 0.8 x10E3/uL (ref 0.1–0.9)
Monocytes: 9 %
NRBC: 1 % — ABNORMAL HIGH (ref 0–0)
Neutrophils Absolute: 4.3 x10E3/uL (ref 1.4–7.0)
Neutrophils: 48 %
Platelets: 661 x10E3/uL — ABNORMAL HIGH (ref 150–450)
Potassium: 4.8 mmol/L (ref 3.5–5.2)
RBC: 3.9 x10E6/uL — ABNORMAL LOW (ref 4.14–5.80)
RDW: 22 % — ABNORMAL HIGH (ref 11.6–15.4)
Retic Ct Pct: 5.4 % — ABNORMAL HIGH (ref 0.6–2.6)
Sodium: 135 mmol/L (ref 134–144)
Total Protein: 8.3 g/dL (ref 6.0–8.5)
Vit D, 25-Hydroxy: 15.2 ng/mL — ABNORMAL LOW (ref 30.0–100.0)
WBC: 9 x10E3/uL (ref 3.4–10.8)
eGFR: 126 mL/min/1.73 (ref >59)

## 2024-02-20 LAB — CHLAMYDIA/GONOCOCCUS/TRICHOMONAS, NAA
Chlamydia by NAA: NEGATIVE
Gonococcus by NAA: NEGATIVE
Trich vag by NAA: NEGATIVE

## 2024-02-20 LAB — RPR+HIV+GC+CT PANEL
HIV Screen 4th Generation wRfx: NONREACTIVE
RPR Ser Ql: NONREACTIVE

## 2024-02-21 ENCOUNTER — Ambulatory Visit: Payer: Self-pay | Admitting: Nurse Practitioner

## 2024-02-22 LAB — TOXASSURE FLEX 15, UR
6-ACETYLMORPHINE IA: NEGATIVE ng/mL
7-aminoclonazepam: NOT DETECTED ng/mg{creat}
AMPHETAMINES IA: NEGATIVE ng/mL
Alpha-hydroxyalprazolam: NOT DETECTED ng/mg{creat}
Alpha-hydroxymidazolam: NOT DETECTED ng/mg{creat}
Alpha-hydroxytriazolam: NOT DETECTED ng/mg{creat}
Alprazolam: NOT DETECTED ng/mg{creat}
BARBITURATES IA: NEGATIVE ng/mL
Buprenorphine: NOT DETECTED ng/mg{creat}
COCAINE METABOLITE IA: NEGATIVE ng/mL
Clonazepam: NOT DETECTED ng/mg{creat}
Creatinine: 81 mg/dL (ref >=20)
Desalkylflurazepam: NOT DETECTED ng/mg{creat}
Desmethyldiazepam: NOT DETECTED ng/mg{creat}
Desmethylflunitrazepam: NOT DETECTED ng/mg{creat}
Diazepam: NOT DETECTED ng/mg{creat}
ETHYL ALCOHOL Enzymatic: NEGATIVE g/dL
Fentanyl: NOT DETECTED ng/mg{creat}
Flunitrazepam: NOT DETECTED ng/mg{creat}
Lorazepam: NOT DETECTED ng/mg{creat}
METHADONE IA: NEGATIVE ng/mL
METHADONE MTB IA: NEGATIVE ng/mL
Midazolam: NOT DETECTED ng/mg{creat}
Norbuprenorphine: NOT DETECTED ng/mg{creat}
Norfentanyl: NOT DETECTED ng/mg{creat}
OPIATE CLASS IA: NEGATIVE ng/mL
OXYCODONE CLASS IA: NEGATIVE ng/mL
Oxazepam: NOT DETECTED ng/mg{creat}
PHENCYCLIDINE IA: NEGATIVE ng/mL
TAPENTADOL, IA: NEGATIVE ng/mL
TRAMADOL IA: NEGATIVE ng/mL
Temazepam: NOT DETECTED ng/mg{creat}

## 2024-02-22 LAB — CANNABINOIDS, MS, UR RFX
Cannabinoids Confirmation: POSITIVE
Carboxy-THC: 68 ng/mg{creat}

## 2024-02-23 ENCOUNTER — Encounter: Payer: Self-pay | Admitting: Nurse Practitioner

## 2024-02-23 ENCOUNTER — Ambulatory Visit (INDEPENDENT_AMBULATORY_CARE_PROVIDER_SITE_OTHER): Admitting: Nurse Practitioner

## 2024-02-23 VITALS — BP 119/63 | HR 82 | Temp 98.2°F | Wt 175.0 lb

## 2024-02-23 DIAGNOSIS — R6889 Other general symptoms and signs: Secondary | ICD-10-CM

## 2024-02-23 DIAGNOSIS — R0989 Other specified symptoms and signs involving the circulatory and respiratory systems: Secondary | ICD-10-CM | POA: Diagnosis not present

## 2024-02-23 LAB — POC COVID19/FLU A&B COMBO
Covid Antigen, POC: NEGATIVE
Influenza A Antigen, POC: NEGATIVE
Influenza B Antigen, POC: NEGATIVE

## 2024-02-23 MED ORDER — AZITHROMYCIN 250 MG PO TABS: ORAL_TABLET | ORAL | 0 refills | Status: AC

## 2024-02-23 NOTE — Progress Notes (Signed)
 Subjective   Patient ID: Derrick Wu, male    DOB: [birth year], 28 y.o.   MRN: [id number]  Chief Complaint  Patient presents with   covid test    Patient stated that he would like a covid test    Referring provider: Oley Bascom RAMAN, NP  Derrick Wu is a 28 y.o. male with Past Medical History: No date: Asthma No date: Headache(784.0) No date: Pneumonia No date: Seasonal allergies No date: Sickle cell anemia (HCC) No date: Vision abnormalities     Comment:  Hx: of wears glasses 09/2020: Vitamin D  deficiency   HPI  Patient presents today for an acute visit.  Patient has been having congestion head and chest - started this past Monday - denies chills - did have fatigue.  COVID and flu test in office were negative today.  Will trial azithromycin . Denies f/c/s, n/v/d, hemoptysis, PND, leg swelling. Denies chest pain or edema.     Allergies  Allergen Reactions   Mushroom Extract Complex (Obsolete) Nausea And Vomiting    Immunization History  Administered Date(s) Administered   Meningococcal Conjugate 02/22/2007, 11/05/2010   Pneumococcal Polysaccharide-23 11/23/2001, 02/22/2007, 01/08/2016   Tdap 01/27/2022    Tobacco History: Social History   Tobacco Use  Smoking Status Never  Smokeless Tobacco Never   Counseling given: Not Answered   Outpatient Encounter Medications as of 02/23/2024  Medication Sig   azithromycin  (ZITHROMAX ) 250 MG tablet Take 2 tablets on day 1, then 1 tablet daily on days 2 through 5   cyanocobalamin (VITAMIN B12) 1000 MCG tablet Take 1 tablet (1,000 mcg total) by mouth daily.   folic acid  (FOLVITE ) 1 MG tablet Take 1 tablet (1 mg total) by mouth daily.   oxyCODONE -acetaminophen  (PERCOCET) 10-325 MG tablet Take 1 tablet by mouth every 8 (eight) hours as needed for pain.   QUERCETIN PO Take by mouth.   Vitamin D , Ergocalciferol , (DRISDOL ) 1.25 MG (50000 UNIT) CAPS capsule Take 1 capsule (50,000 Units total) by mouth every 7  (seven) days.   cetirizine  (ZYRTEC ) 10 MG tablet TAKE 1 TABLET BY MOUTH EVERY DAY (Patient not taking: Reported on 11/19/2022)   fexofenadine  (ALLEGRA  ALLERGY) 180 MG tablet Take 1 tablet (180 mg total) by mouth daily. (Patient not taking: Reported on 11/12/2023)   ibuprofen  (ADVIL ) 800 MG tablet Take 1 tablet (800 mg total) by mouth every 8 (eight) hours as needed. (Patient not taking: Reported on 11/12/2023)   No facility-administered encounter medications on file as of 02/23/2024.    Review of Systems  Review of Systems  Constitutional: Negative.   HENT: Negative.    Cardiovascular: Negative.   Gastrointestinal: Negative.   Allergic/Immunologic: Negative.   Neurological: Negative.   Psychiatric/Behavioral: Negative.       Objective:   BP 119/63   Pulse 82   Temp 98.2 F (36.8 C) (Oral)   Wt 175 lb (79.4 kg)   SpO2 96%   BMI 23.09 kg/m   Wt Readings from Last 5 Encounters:  02/23/24 175 lb (79.4 kg)  02/18/24 175 lb (79.4 kg)  11/12/23 175 lb (79.4 kg)  08/13/23 176 lb 3.2 oz (79.9 kg)  02/19/23 166 lb (75.3 kg)     Physical Exam Vitals and nursing note reviewed.  Constitutional:      General: He is not in acute distress.    Appearance: He is well-developed.  Cardiovascular:     Rate and Rhythm: Normal rate and regular rhythm.  Pulmonary:     Effort: Pulmonary  effort is normal.     Breath sounds: Normal breath sounds.  Skin:    General: Skin is warm and dry.  Neurological:     Mental Status: He is alert and oriented to person, place, and time.       Assessment & Plan:   Flu-like symptoms -     POC Covid19/Flu A&B Antigen  Chest congestion -     Azithromycin ; Take 2 tablets on day 1, then 1 tablet daily on days 2 through 5  Dispense: 6 tablet; Refill: 0     Return if symptoms worsen or fail to improve.   [name], NP 02/23/2024

## 2024-03-02 ENCOUNTER — Other Ambulatory Visit: Payer: Self-pay | Admitting: Nurse Practitioner

## 2024-03-02 ENCOUNTER — Telehealth: Payer: Self-pay

## 2024-03-02 DIAGNOSIS — D571 Sickle-cell disease without crisis: Secondary | ICD-10-CM

## 2024-03-02 DIAGNOSIS — E559 Vitamin D deficiency, unspecified: Secondary | ICD-10-CM

## 2024-03-02 MED ORDER — OXYCODONE-ACETAMINOPHEN 10-325 MG PO TABS: 1.0000 | ORAL_TABLET | Freq: Three times a day (TID) | ORAL | 0 refills | Status: DC | PRN

## 2024-03-02 MED ORDER — VITAMIN D (ERGOCALCIFEROL) 1.25 MG (50000 UNIT) PO CAPS: 50000.0000 [IU] | ORAL_CAPSULE | ORAL | 5 refills | Status: DC

## 2024-03-02 NOTE — Telephone Encounter (Signed)
 Copied from CRM [id number]. Topic: Clinical - Medication Refill >> Mar 02, 2024  1:48 PM Fonda T wrote: Medication: oxyCODONE -acetaminophen  (PERCOCET) 10-325 MG tablet  Has the patient contacted their pharmacy? Yes, advised to contact office   This is the patient's preferred pharmacy:  [id number] [address], [place] - [address] [place] [address] Phone: [phone] Fax: [phone]    Is this the correct pharmacy for this prescription? Yes If no, delete pharmacy and type the correct one.   Has the prescription been filled recently? Yes  Is the patient out of the medication? Yes  Has the patient been seen for an appointment in the last year OR does the patient have an upcoming appointment? Yes  Can we respond through MyChart? Yes  Agent: Please be advised that Rx refills may take up to 3 business days. We ask that you follow-up with your pharmacy.

## 2024-03-02 NOTE — Telephone Encounter (Signed)
 Copied from CRM [id number]. Topic: Clinical - Medication Question >> Mar 02, 2024  1:45 PM Fonda T wrote: Reason for CRM: Patient calling states pharmacy advised patient that medication, Vitamin D , Ergocalciferol , (DRISDOL ) 1.25 MG (50000 UNIT) CAPS capsule, was not in stock.  Patient is wanting to know ho to proceed with taking medication.  Please call back to advise further, can be reached at [phone].  Patient is aware of same day call back.  [id number] [address], [place] - [address] [place] [address] Phone: [phone] Fax: [phone]

## 2024-03-03 ENCOUNTER — Telehealth: Payer: Self-pay

## 2024-03-03 ENCOUNTER — Telehealth: Payer: Self-pay | Admitting: Nurse Practitioner

## 2024-03-03 NOTE — Telephone Encounter (Signed)
 Copied from CRM [id number]. Topic: Referral - Status >> Mar 02, 2024  1:49 PM Fonda T wrote: Reason for CRM: Patient inquiring on status of cardiology referral, states he never received a call to schedule an appointment.   Per chart review, for cardiology referral, is pending status.  Patient is requesting a return call if he is ok to call and schedule. Can be reached at [phone]   Patient also given contact number to contact referral sent to cardiology,  [address] [place] [address] [facility] at [phone].

## 2024-03-03 NOTE — Telephone Encounter (Signed)
 Sent message to provider

## 2024-03-22 ENCOUNTER — Telehealth: Payer: Self-pay

## 2024-03-22 ENCOUNTER — Other Ambulatory Visit: Payer: Self-pay | Admitting: Nurse Practitioner

## 2024-03-22 DIAGNOSIS — D571 Sickle-cell disease without crisis: Secondary | ICD-10-CM

## 2024-03-22 MED ORDER — OXYCODONE-ACETAMINOPHEN 10-325 MG PO TABS: 1.0000 | ORAL_TABLET | Freq: Three times a day (TID) | ORAL | 0 refills | Status: DC | PRN

## 2024-03-22 NOTE — Telephone Encounter (Signed)
 Please advise [facility]

## 2024-03-22 NOTE — Telephone Encounter (Signed)
 Copied from CRM [id number]. Topic: Clinical - Medication Refill >> Mar 22, 2024  9:05 AM Precious C wrote: Medication: oxyCODONE -acetaminophen  (PERCOCET) 10-325 MG tablet  Has the patient contacted their pharmacy? Yes (Agent: If no, request that the patient contact the pharmacy for the refill. If patient does not wish to contact the pharmacy document the reason why and proceed with request.) (Agent: If yes, when and what did the pharmacy advise?)  This is the patient's preferred pharmacy:  [phone] [address] [facility], [place] - [address] [place] [address] Phone: [phone] Fax: [phone]   Is this the correct pharmacy for this prescription? Yes If no, delete pharmacy and type the correct one.   Has the prescription been filled recently? No  Is the patient out of the medication? Yes  Has the patient been seen for an appointment in the last year OR does the patient have an upcoming appointment? Yes  Can we respond through MyChart? Yes  Agent: Please be advised that Rx refills may take up to 3 business days. We ask that you follow-up with your pharmacy.

## 2024-03-22 NOTE — Telephone Encounter (Signed)
 Can you tell me the status of the cardiology referral that was placed on 02/18/24 by Bascom Borer.

## 2024-03-23 NOTE — Telephone Encounter (Signed)
 This is the information regarding this patient's appointment.

## 2024-04-04 ENCOUNTER — Telehealth: Payer: Self-pay | Admitting: Nurse Practitioner

## 2024-04-04 NOTE — Telephone Encounter (Signed)
 Copied from CRM #[id number]. Topic: General - Other >> Apr 04, 2024  1:40 PM Montie POUR wrote: Reason for CRM:  Panayiotis is checking to see if he can get a note for being out of work on 04/01/24 from NP [place].  He was in a lot of pain with his sickle cell pain and he was not able to stand for a long period of time. Please call him at [phone] to discuss.

## 2024-04-06 NOTE — Telephone Encounter (Signed)
 Are you abe to produce this ?

## 2024-04-07 ENCOUNTER — Ambulatory Visit: Admitting: Cardiovascular Disease

## 2024-04-10 ENCOUNTER — Telehealth: Payer: Self-pay | Admitting: Nurse Practitioner

## 2024-04-10 DIAGNOSIS — D571 Sickle-cell disease without crisis: Secondary | ICD-10-CM

## 2024-04-10 DIAGNOSIS — E559 Vitamin D deficiency, unspecified: Secondary | ICD-10-CM

## 2024-04-10 NOTE — Telephone Encounter (Unsigned)
 Copied from CRM [id number]. Topic: Clinical - Medication Refill >> Apr 10, 2024 10:56 AM Shanda MATSU wrote: Medication: Vitamin D , Ergocalciferol , (DRISDOL ) 1.25 MG (50000 UNIT) CAPS capsule oxyCODONE -acetaminophen  (PERCOCET) 10-325 MG tablet  Has the patient contacted their pharmacy? No (Agent: If no, request that the patient contact the pharmacy for the refill. If patient does not wish to contact the pharmacy document the reason why and proceed with request.) (Agent: If yes, when and what did the pharmacy advise?)  This is the patient's preferred pharmacy:  [phone] [address] [facility] [place] - [address] [place] [address] Phone: [phone] Fax: [phone]  Is this the correct pharmacy for this prescription? Yes If no, delete pharmacy and type the correct one.   Has the prescription been filled recently? No  Is the patient out of the medication? Yes  Has the patient been seen for an appointment in the last year OR does the patient have an upcoming appointment? Yes  Can we respond through MyChart? Yes  Agent: Please be advised that Rx refills may take up to 3 business days. We ask that you follow-up with your pharmacy.

## 2024-04-10 NOTE — Telephone Encounter (Signed)
 [facility] Refill request not appropriate at this time.

## 2024-04-10 NOTE — Telephone Encounter (Signed)
 I  have provided pt a letter last week. Called vpt to find out if he still needs another note. KH

## 2024-04-11 NOTE — Telephone Encounter (Signed)
 Please advise [facility]

## 2024-04-12 MED ORDER — OXYCODONE-ACETAMINOPHEN 10-325 MG PO TABS: 1.0000 | ORAL_TABLET | Freq: Three times a day (TID) | ORAL | 0 refills | Status: DC | PRN

## 2024-04-27 ENCOUNTER — Other Ambulatory Visit: Payer: Self-pay | Admitting: Nurse Practitioner

## 2024-04-27 DIAGNOSIS — D571 Sickle-cell disease without crisis: Secondary | ICD-10-CM

## 2024-04-27 NOTE — Telephone Encounter (Signed)
 Copied from CRM #[id number]. Topic: Clinical - Medication Refill >> Apr 27, 2024  1:55 PM Edsel HERO wrote: Medication: oxyCODONE -acetaminophen  (PERCOCET) 10-325 MG tablet  Has the patient contacted their pharmacy? No  This is the patient's preferred pharmacy:  [phone] [address] [facility] [place] - [address] [place] [address] Phone: [phone] Fax: [phone]  Is this the correct pharmacy for this prescription? Yes If no, delete pharmacy and type the correct one.   Has the prescription been filled recently? Yes  Is the patient out of the medication? Yes  Has the patient been seen for an appointment in the last year OR does the patient have an upcoming appointment? Yes  Can we respond through MyChart? Yes

## 2024-04-27 NOTE — Progress Notes (Deleted)
    Cardiology Office Note Date:  04/27/2024  ID:  MOHAMEDAMIN NIFONG, DOB 06-Mar-1996, MRN [id number] PCP:  Oley Bascom RAMAN, NP  Cardiologist:   Joelle VEAR Ren Donley, MD  No chief complaint on file.     Problems Sickle cell SS disease Chronic CP  Visits  ***    History of Present Illness: Derrick Wu is a 28 y.o. male who presents for new visit.   ROS: Please see the history of present illness. All other systems are reviewed and negative.   Past Medical History:  Diagnosis Date   Asthma    Headache(784.0)    Pneumonia    Seasonal allergies    Sickle cell anemia (HCC)    Vision abnormalities    Hx: of wears glasses   Vitamin D  deficiency 09/2020    Past Surgical History:  Procedure Laterality Date   TOOTH EXTRACTION N/A 05/26/2013   Procedure: EXTRACTION WISDOM TEETH - one, sixteen, seventeen and thirtytwo.;  Surgeon: Glendia CHRISTELLA Primrose, DDS;  Location: [facility] OR;  Service: Oral Surgery;  Laterality: N/A;    Current Outpatient Medications  Medication Sig Dispense Refill   cetirizine  (ZYRTEC ) 10 MG tablet TAKE 1 TABLET BY MOUTH EVERY DAY (Patient not taking: Reported on 11/19/2022) 30 tablet 2   cyanocobalamin (VITAMIN B12) 1000 MCG tablet Take 1 tablet (1,000 mcg total) by mouth daily. 90 tablet 1   fexofenadine  (ALLEGRA  ALLERGY) 180 MG tablet Take 1 tablet (180 mg total) by mouth daily. (Patient not taking: Reported on 11/12/2023) 90 tablet 2   folic acid  (FOLVITE ) 1 MG tablet Take 1 tablet (1 mg total) by mouth daily. 90 tablet 3   ibuprofen  (ADVIL ) 800 MG tablet Take 1 tablet (800 mg total) by mouth every 8 (eight) hours as needed. (Patient not taking: Reported on 11/12/2023) 30 tablet 6   oxyCODONE -acetaminophen  (PERCOCET) 10-325 MG tablet Take 1 tablet by mouth every 8 (eight) hours as needed for pain. 30 tablet 0   QUERCETIN PO Take by mouth.     Vitamin D , Ergocalciferol , (DRISDOL ) 1.25 MG (50000 UNIT) CAPS capsule Take 1 capsule (50,000 Units total) by mouth  every 7 (seven) days. 5 capsule 5   No current facility-administered medications for this visit.    Allergies:   Mushroom extract complex (obsolete)   Social History:  see above  Family History:  see above  PHYSICAL EXAM: VS:  There were no vitals taken for this visit. , BMI There is no height or weight on file to calculate BMI. GEN: Well nourished, well developed, in no acute distress HEENT: normal Neck: no JVD, carotid bruits, or masses Cardiac: ***RRR; no murmurs, rubs, or gallops,no edema  Respiratory:  CTAB bilaterally, normal work of breathing GI: soft, nontender, nondistended, + BS Extremities: No LE edema Skin: warm and dry, no rash Neuro:  Strength and sensation are intact  EKG: ***  Recent Labs: Reviewed  Studies: Reviewed  ASSESSMENT AND PLAN: Derrick Wu is a 28 y.o. male who presents for new visit.  - *** - *** - *** - ***   Signed, Joelle VEAR Ren Donley, MD  04/27/2024 9:56 AM    [facility]

## 2024-04-28 ENCOUNTER — Ambulatory Visit

## 2024-04-28 DIAGNOSIS — G894 Chronic pain syndrome: Secondary | ICD-10-CM

## 2024-04-28 DIAGNOSIS — D571 Sickle-cell disease without crisis: Secondary | ICD-10-CM

## 2024-04-28 DIAGNOSIS — R0789 Other chest pain: Secondary | ICD-10-CM

## 2024-04-28 NOTE — Telephone Encounter (Signed)
 Please advise [facility]

## 2024-05-01 MED ORDER — OXYCODONE-ACETAMINOPHEN 10-325 MG PO TABS: 1.0000 | ORAL_TABLET | Freq: Three times a day (TID) | ORAL | 0 refills | Status: DC | PRN

## 2024-05-18 ENCOUNTER — Other Ambulatory Visit: Payer: Self-pay | Admitting: Nurse Practitioner

## 2024-05-18 DIAGNOSIS — D571 Sickle-cell disease without crisis: Secondary | ICD-10-CM

## 2024-05-18 NOTE — Telephone Encounter (Unsigned)
 Copied from CRM [id number]. Topic: Clinical - Medication Refill >> May 18, 2024 10:06 AM Emylou G wrote: Medication: oxyCODONE -acetaminophen  (PERCOCET) 10-325 MG tablet  Has the patient contacted their pharmacy? Yes (Agent: If no, request that the patient contact the pharmacy for the refill. If patient does not wish to contact the pharmacy document the reason why and proceed with request.) (Agent: If yes, when and what did the pharmacy advise?) said to call us   This is the patient's preferred pharmacy:  [id number] [address], [place] - [address] [place] [address] [place] [address] Phone: [phone] Fax: [phone]  Is this the correct pharmacy for this prescription? Yes If no, delete pharmacy and type the correct one.   Has the prescription been filled recently? No  Is the patient out of the medication? Yes  Has the patient been seen for an appointment in the last year OR does the patient have an upcoming appointment? Yes  Can we respond through MyChart? Yes  Agent: Please be advised that Rx refills may take up to 3 business days. We ask that you follow-up with your pharmacy.

## 2024-05-19 ENCOUNTER — Ambulatory Visit: Payer: Self-pay | Admitting: Nurse Practitioner

## 2024-05-19 MED ORDER — OXYCODONE-ACETAMINOPHEN 10-325 MG PO TABS: 1.0000 | ORAL_TABLET | Freq: Three times a day (TID) | ORAL | 0 refills | Status: DC | PRN

## 2024-05-26 ENCOUNTER — Ambulatory Visit: Payer: Self-pay | Admitting: Nurse Practitioner

## 2024-05-26 ENCOUNTER — Encounter: Payer: Self-pay | Admitting: Nurse Practitioner

## 2024-05-26 ENCOUNTER — Ambulatory Visit

## 2024-05-26 ENCOUNTER — Ambulatory Visit (INDEPENDENT_AMBULATORY_CARE_PROVIDER_SITE_OTHER): Admitting: Nurse Practitioner

## 2024-05-26 VITALS — BP 111/67 | HR 88 | Ht 73.0 in | Wt 175.5 lb

## 2024-05-26 VITALS — BP 135/60 | HR 90 | Wt 175.0 lb

## 2024-05-26 DIAGNOSIS — E559 Vitamin D deficiency, unspecified: Secondary | ICD-10-CM

## 2024-05-26 DIAGNOSIS — D571 Sickle-cell disease without crisis: Secondary | ICD-10-CM

## 2024-05-26 DIAGNOSIS — R0789 Other chest pain: Secondary | ICD-10-CM | POA: Diagnosis not present

## 2024-05-26 MED ORDER — VITAMIN B-12 1000 MCG PO TABS: 1000.0000 ug | ORAL_TABLET | Freq: Every day | ORAL | 1 refills | Status: AC

## 2024-05-26 MED ORDER — VITAMIN D (ERGOCALCIFEROL) 1.25 MG (50000 UNIT) PO CAPS: 50000.0000 [IU] | ORAL_CAPSULE | ORAL | 5 refills | Status: AC

## 2024-05-26 NOTE — Progress Notes (Signed)
 Subjective   Patient ID: Derrick Wu, male    DOB: 04-Nov-1995, 28 y.o.   MRN: [id number]  Chief Complaint  Patient presents with   Sickle Cell Anemia   Medication Refill    Vitamin D  and B12    Referring provider: Oley Bascom RAMAN, NP  Wilkie E Goon is a 28 y.o. male with Past Medical History: No date: Asthma No date: Headache(784.0) No date: Pneumonia No date: Seasonal allergies No date: Sickle cell anemia (HCC) No date: Vision abnormalities     Comment:  Hx: of wears glasses 09/2020: Vitamin D  deficiency   HPI   28 year old male with history of vitamin D  deficiency sickle cell Hb-SS disease, chronic pain, seasonal allergies.    Patient presents today for sickle cell follow-up.  He is requesting STD testing today. 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 Overall doing well. Denies f/c/s, n/v/d, hemoptysis, PND, leg swelling. Denies chest pain or edema.     Allergies[1]  Immunization History  Administered Date(s) Administered   Meningococcal Conjugate 02/22/2007, 11/05/2010   Pneumococcal Polysaccharide-23 11/23/2001, 02/22/2007, 01/08/2016   Tdap 01/27/2022    Tobacco History: Tobacco Use History[2] Counseling given: Not Answered   Outpatient Encounter Medications as of 05/26/2024  Medication Sig   folic acid  (FOLVITE ) 1 MG tablet Take 1 tablet (1 mg total) by mouth daily.   oxyCODONE -acetaminophen  (PERCOCET) 10-325 MG tablet Take 1 tablet by mouth every 8 (eight) hours as needed for pain.   QUERCETIN PO Take by mouth. (Patient taking differently: Take 1 tablet by mouth as needed (for allergies).)   [DISCONTINUED] cyanocobalamin (VITAMIN B12) 1000 MCG tablet Take 1 tablet (1,000 mcg total) by mouth daily.   [DISCONTINUED] Vitamin D , Ergocalciferol , (DRISDOL ) 1.25 MG (50000 UNIT) CAPS capsule Take 1 capsule (50,000 Units total) by mouth every 7 (seven) days.   cyanocobalamin (VITAMIN B12) 1000 MCG  tablet Take 1 tablet (1,000 mcg total) by mouth daily.   Vitamin D , Ergocalciferol , (DRISDOL ) 1.25 MG (50000 UNIT) CAPS capsule Take 1 capsule (50,000 Units total) by mouth every 7 (seven) days.   No facility-administered encounter medications on file as of 05/26/2024.    Review of Systems  Review of Systems  Constitutional: Negative.   HENT: Negative.    Cardiovascular: Negative.   Gastrointestinal: Negative.   Allergic/Immunologic: Negative.   Neurological: Negative.   Psychiatric/Behavioral: Negative.       Objective:   BP 135/60 (BP Location: Right Arm, Patient Position: Sitting, Cuff Size: Large)   Pulse 90   Wt 175 lb (79.4 kg)   SpO2 100%   BMI 23.09 kg/m   Wt Readings from Last 5 Encounters:  05/26/24 175 lb (79.4 kg)  05/26/24 175 lb 8 oz (79.6 kg)  02/23/24 175 lb (79.4 kg)  02/18/24 175 lb (79.4 kg)  11/12/23 175 lb (79.4 kg)     Physical Exam Vitals and nursing note reviewed.  Constitutional:      General: He is not in acute distress.    Appearance: He is well-developed.  Cardiovascular:     Rate and Rhythm: Normal rate and regular rhythm.  Pulmonary:     Effort: Pulmonary effort is normal.     Breath sounds: Normal breath sounds.  Skin:    General: Skin is warm and dry.  Neurological:     Mental Status: He is alert and oriented to person, place, and time.       Assessment & Plan:   Hb-SS  disease without crisis (HCC) -     ToxAssure Flex 15, Ur  Vitamin D  deficiency -     Vitamin D  (Ergocalciferol ); Take 1 capsule (50,000 Units total) by mouth every 7 (seven) days.  Dispense: 5 capsule; Refill: 5  Other orders -     Vitamin B-12; Take 1 tablet (1,000 mcg total) by mouth daily.  Dispense: 90 tablet; Refill: 1     Return in about 3 months (around 08/24/2024).    [name], NP 05/26/2024     [1]  Allergies Allergen Reactions   Mushroom Extract Complex (Obsolete) Nausea And Vomiting  [2]  Social History Tobacco Use   Smoking Status Never  Smokeless Tobacco Never

## 2024-05-26 NOTE — Patient Instructions (Signed)
 Medication Instructions:  NO CHANGES *If you need a refill on your cardiac medications before your next appointment, please call your pharmacy*  Lab Work: LIPID PANEL,LIPOPROTEIN A, AND FRUCTOSAMINE TODAY If you have labs (blood work) drawn today and your tests are completely normal, you will receive your results only by: MyChart Message (if you have MyChart) OR A paper copy in the mail If you have any lab test that is abnormal or we need to change your treatment, we will call you to review the results.  Testing/Procedures:[address] Your physician has requested that you have an echocardiogram. Echocardiography is a painless test that uses sound waves to create images of your heart. It provides your doctor with information about the size and shape of your heart and how well your hearts chambers and valves are working. This procedure takes approximately one hour. There are no restrictions for this procedure. Please do NOT wear cologne, perfume, aftershave, or lotions (deodorant is allowed). Please arrive 15 minutes prior to your appointment time.  Please note: We ask at that you not bring children with you during ultrasound (echo/ vascular) testing. Due to room size and safety concerns, children are not allowed in the ultrasound rooms during exams. Our front office staff cannot provide observation of children in our lobby area while testing is being conducted. An adult accompanying a patient to their appointment will only be allowed in the ultrasound room at the discretion of the ultrasound technician under special circumstances. We apologize for any inconvenience.   Follow-Up: At [facility], you and your health needs are our priority.  As part of our continuing mission to provide you with exceptional heart care, our providers are all part of one team.  This team includes your primary Cardiologist (physician) and Advanced Practice Providers or APPs (Physician Assistants and Nurse  Practitioners) who all work together to provide you with the care you need, when you need it.  Your next appointment:   FOLLOW UP AS NEEDED

## 2024-05-26 NOTE — Progress Notes (Signed)
 Cardiology Office Note Date:  05/26/2024  ID:  Derrick, Wu [birth year], MRN [id number] PCP:  Derrick Bascom RAMAN, NP  Cardiologist:   Derrick VEAR Ren Donley, MD  Chief Complaint  Patient presents with   Chest Pain      Problems Sickle cell SS disease Chronic CP  Visits  12/25: TTE, Fructosamine, lipid panel, HA1C    History of Present Illness: Discussed the use of AI scribe software for clinical note transcription with the patient, who gave verbal consent to proceed.  Derrick Wu is a 28 year old male with sickle cell disease who presents for a cardiac evaluation due to previous chest pain during exercise. He was referred by his general doctor for a cardiac check-up. About a year ago,  he had chest pain after intense workouts while taking testosterone and energy powder supplements. An EKG at that time was reported as normal. Since stopping those supplements,  he has not had chest pain with exercise and tolerates regular workouts while using creatine without symptoms. He currently denies chest pain or shortness of breath with usual exercise and only notes limitation with long-distance running. He has no personal history of heart disease.Family history is notable for his father dying from cardiac arrest at 1 with diabetes, hyperlipidemia, and prior stroke, and a grandfather who had a stroke with residual left-sided paralysis. He smokes occasionally, last use around Thanksgiving, and drinks alcohol minimally at social events. He works as a leisure centre manager.    ROS: Please see the history of present illness. All other systems are reviewed and negative.   Past Medical History:  Diagnosis Date   Asthma    Headache(784.0)    Pneumonia    Seasonal allergies    Sickle cell anemia (HCC)    Vision abnormalities    Hx: of wears glasses   Vitamin D  deficiency 09/2020    Past Surgical History:  Procedure Laterality Date   TOOTH EXTRACTION N/A 05/26/2013   Procedure:  EXTRACTION WISDOM TEETH - one, sixteen, seventeen and thirtytwo.;  Surgeon: Derrick Wu, DDS;  Location: [facility] OR;  Service: Oral Surgery;  Laterality: N/A;    Current Outpatient Medications  Medication Sig Dispense Refill   cyanocobalamin (VITAMIN B12) 1000 MCG tablet Take 1 tablet (1,000 mcg total) by mouth daily. 90 tablet 1   folic acid  (FOLVITE ) 1 MG tablet Take 1 tablet (1 mg total) by mouth daily. 90 tablet 3   oxyCODONE -acetaminophen  (PERCOCET) 10-325 MG tablet Take 1 tablet by mouth every 8 (eight) hours as needed for pain. 30 tablet 0   QUERCETIN PO Take by mouth. (Patient taking differently: Take 1 tablet by mouth as needed (for allergies).)     Vitamin D , Ergocalciferol , (DRISDOL ) 1.25 MG (50000 UNIT) CAPS capsule Take 1 capsule (50,000 Units total) by mouth every 7 (seven) days. 5 capsule 5   No current facility-administered medications for this visit.    Allergies:   Mushroom extract complex (obsolete)   Social History:  see above  Family History:  see above  PHYSICAL EXAM: VS:  BP 111/67   Pulse 88   Ht 6' 1 (1.854 m)   Wt 175 lb 8 oz (79.6 kg)   SpO2 96%   BMI 23.15 kg/m  , BMI Body mass index is 23.15 kg/m. GEN: Well nourished, well developed, in no acute distress HEENT: normal Neck: no JVD, carotid bruits, or masses Cardiac: RRR; no murmurs, rubs, or gallops,no edema  Respiratory:  CTAB bilaterally, normal work  of breathing GI: soft, nontender, nondistended, + BS Extremities: No LE edema Skin: warm and dry, no rash Neuro:  Strength and sensation are intact  Recent Labs: Reviewed  Studies: Reviewed  ASSESSMENT AND PLAN: Derrick Wu is a 28 y.o. male who presents for new visit.   #Chest pain, resolved Chest pain resolved after stopping supplements, no current symptoms.   #Cardiac risk assessment due to family history of cardiac disease Significant Hx of CV disease with CVA at early age in his dad, and cardiac arrest at the age 82 - Obtain  TTE, Lpa, LP, and fructosamine levels - Follow up PRN   Signed, Derrick VEAR Ren Donley, MD  05/26/2024 11:16 AM    [facility]

## 2024-05-30 LAB — LIPID PANEL
Chol/HDL Ratio: 4.3 ratio (ref 0.0–5.0)
Cholesterol, Total: 179 mg/dL (ref 100–199)
HDL: 42 mg/dL (ref >39)
LDL Chol Calc (NIH): 123 mg/dL — ABNORMAL HIGH (ref 0–99)
Triglycerides: 77 mg/dL (ref 0–149)
VLDL Cholesterol Cal: 14 mg/dL (ref 5–40)

## 2024-05-30 LAB — LIPOPROTEIN A (LPA): Lipoprotein (a): 95.2 nmol/L — AB (ref <75.0)

## 2024-05-30 LAB — FRUCTOSAMINE: Fructosamine: 251 umol/L (ref 0–285)

## 2024-05-31 ENCOUNTER — Ambulatory Visit: Payer: Self-pay

## 2024-05-31 LAB — TOXASSURE FLEX 15, UR
6-ACETYLMORPHINE IA: NEGATIVE ng/mL
7-aminoclonazepam: NOT DETECTED ng/mg{creat}
AMPHETAMINES IA: NEGATIVE ng/mL
Alpha-hydroxyalprazolam: NOT DETECTED ng/mg{creat}
Alpha-hydroxymidazolam: NOT DETECTED ng/mg{creat}
Alpha-hydroxytriazolam: NOT DETECTED ng/mg{creat}
Alprazolam: NOT DETECTED ng/mg{creat}
BARBITURATES IA: NEGATIVE ng/mL
Buprenorphine: NOT DETECTED ng/mg{creat}
COCAINE METABOLITE IA: NEGATIVE ng/mL
Clonazepam: NOT DETECTED ng/mg{creat}
Creatinine: 173 mg/dL (ref >=20)
Desalkylflurazepam: NOT DETECTED ng/mg{creat}
Desmethyldiazepam: NOT DETECTED ng/mg{creat}
Desmethylflunitrazepam: NOT DETECTED ng/mg{creat}
Diazepam: NOT DETECTED ng/mg{creat}
ETHYL ALCOHOL Enzymatic: NEGATIVE g/dL
Fentanyl: NOT DETECTED ng/mg{creat}
Flunitrazepam: NOT DETECTED ng/mg{creat}
Lorazepam: NOT DETECTED ng/mg{creat}
METHADONE IA: NEGATIVE ng/mL
METHADONE MTB IA: NEGATIVE ng/mL
Midazolam: NOT DETECTED ng/mg{creat}
Norbuprenorphine: NOT DETECTED ng/mg{creat}
Norfentanyl: NOT DETECTED ng/mg{creat}
OPIATE CLASS IA: NEGATIVE ng/mL
Oxazepam: NOT DETECTED ng/mg{creat}
PHENCYCLIDINE IA: NEGATIVE ng/mL
TAPENTADOL, IA: NEGATIVE ng/mL
TRAMADOL IA: NEGATIVE ng/mL
Temazepam: NOT DETECTED ng/mg{creat}

## 2024-05-31 LAB — CANNABINOIDS, MS, UR RFX
Cannabinoids Confirmation: POSITIVE
Carboxy-THC: 43 ng/mg{creat}

## 2024-05-31 LAB — OXYCODONE CLASS, MS, UR RFX
Noroxycodone: 691 ng/mg{creat}
Noroxymorphone: 51 ng/mg{creat}
Oxycodone Class Confirmation: POSITIVE
Oxycodone: 88 ng/mg{creat}
Oxymorphone: 90 ng/mg{creat}

## 2024-06-05 ENCOUNTER — Other Ambulatory Visit: Payer: Self-pay | Admitting: Nurse Practitioner

## 2024-06-05 DIAGNOSIS — D571 Sickle-cell disease without crisis: Secondary | ICD-10-CM

## 2024-06-05 NOTE — Telephone Encounter (Unsigned)
 Copied from CRM #[id number]. Topic: Clinical - Medication Refill >> Jun 05, 2024 10:06 AM Tysheama G wrote: Medication: oxyCODONE -acetaminophen  (PERCOCET) 10-325 MG tablet  Has the patient contacted their pharmacy? Yes (Agent: If no, request that the patient contact the pharmacy for the refill. If patient does not wish to contact the pharmacy document the reason why and proceed with request.) (Agent: If yes, when and what did the pharmacy advise?)  This is the patient's preferred pharmacy:  [phone] [address], [place] - [address]  [place] [address] Phone: [phone] Fax: [phone]  Is this the correct pharmacy for this prescription? Yes If no, delete pharmacy and type the correct one.   Has the prescription been filled recently? No  Is the patient out of the medication? Yes  Has the patient been seen for an appointment in the last year OR does the patient have an upcoming appointment? Yes  Can we respond through MyChart? Yes  Agent: Please be advised that Rx refills may take up to 3 business days. We ask that you follow-up with your pharmacy.

## 2024-06-06 MED ORDER — OXYCODONE-ACETAMINOPHEN 10-325 MG PO TABS: 1.0000 | ORAL_TABLET | Freq: Three times a day (TID) | ORAL | 0 refills | Status: DC | PRN

## 2024-06-23 ENCOUNTER — Ambulatory Visit: Payer: Self-pay | Admitting: Nurse Practitioner

## 2024-06-23 ENCOUNTER — Encounter: Payer: Self-pay | Admitting: Nurse Practitioner

## 2024-06-23 ENCOUNTER — Ambulatory Visit (INDEPENDENT_AMBULATORY_CARE_PROVIDER_SITE_OTHER): Admitting: Nurse Practitioner

## 2024-06-23 VITALS — BP 119/57 | HR 99 | Temp 99.0°F | Wt 177.0 lb

## 2024-06-23 DIAGNOSIS — J069 Acute upper respiratory infection, unspecified: Secondary | ICD-10-CM | POA: Diagnosis not present

## 2024-06-23 NOTE — Patient Instructions (Signed)
 Take Mucinex  as needed for cough and drink at least 64 ounces of water daily to maintain hydration  It is important that you exercise regularly at least 30 minutes 5 times a week as tolerated  Think about what you will eat, plan ahead. Choose  clean, green, fresh or frozen over canned, processed or packaged foods which are more sugary, salty and fatty. 70 to 75% of food eaten should be vegetables and fruit. Three meals at set times with snacks allowed between meals, but they must be fruit or vegetables. Aim to eat over a 12 hour period , example 7 am to 7 pm, and STOP after  your last meal of the day. Drink water,generally about 64 ounces per day, no other drink is as healthy. Fruit juice is best enjoyed in a healthy way, by EATING the fruit.  Thanks for choosing [facility] we consider it a privelige to serve you.

## 2024-06-23 NOTE — Assessment & Plan Note (Signed)
 Compromised immune system increases infection risk. Occasional cough and high energy noted.  - Encouraged hydration. - Advised Mucinex  for cough. General health maintenance Discussed importance of flu vaccination due to sickle cell anemia. - Encouraged flu vaccine.

## 2024-06-23 NOTE — Progress Notes (Signed)
 "  Acute Office Visit  Subjective:     Patient ID: Derrick Wu, male    DOB: 1996-05-10, 29 y.o.   MRN: [id number]  Chief Complaint  Patient presents with   URI    URI  Associated symptoms include coughing. Pertinent negatives include no abdominal pain, chest pain, congestion, dysuria, headaches, nausea, rash, rhinorrhea, sneezing, vomiting or wheezing.     Discussed the use of AI scribe software for clinical note transcription with the patient, who gave verbal consent to proceed.  History of Present Illness Derrick Wu is a 29 year old male with sickle cell disease who presents with a recent history of flu-like symptoms.  On December 24th, he began experiencing severe flu-like symptoms, describing it as feeling like he had 'COVID, flu, Ebola'. These symptoms were significant enough to cancel his Christmas plans. By December 30th, he reported feeling better and was able to attend church on December 31st. Currently, he feels good with an energy level of 'eight, nine' out of ten. He has resumed working out and feels generally well, although he occasionally experiences a 'little cough'.  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 He has not received a flu vaccine since middle school, expressing reluctance due to his sickle cell disease. He manages his health by drinking a lot of fluids, including water and tea from back home, and using natural remedies like ginger, garlic, and honey.  He took two Tylenol  yesterday and is on  Percocet, as part of his regular regimen for sickle cell disease.    Assessment & Plan      Review of Systems  Constitutional:  Negative for appetite change, chills, fatigue and fever.  HENT:  Negative for congestion, postnasal drip, rhinorrhea and sneezing.   Respiratory:  Positive for cough. Negative for shortness of breath and wheezing.         Occasional cough  Cardiovascular:  Negative for chest pain, palpitations and leg swelling.  Gastrointestinal:  Negative for abdominal pain, constipation, nausea and vomiting.  Genitourinary:  Negative for difficulty urinating, dysuria, flank pain and frequency.  Musculoskeletal:  Negative for arthralgias, back pain, joint swelling and myalgias.  Skin:  Negative for color change, pallor, rash and wound.  Neurological:  Negative for dizziness, facial asymmetry, weakness, numbness and headaches.  Psychiatric/Behavioral:  Negative for behavioral problems, confusion, self-injury and suicidal ideas.         Objective:    BP (!) 119/57   Pulse 99   Temp 99 F (37.2 C) (Oral)   Wt 177 lb (80.3 kg)   SpO2 98%   BMI 23.35 kg/m    Physical Exam Vitals and nursing note reviewed.  Constitutional:      General: He is not in acute distress.    Appearance: Normal appearance. He is not ill-appearing, toxic-appearing or diaphoretic.  HENT:     Mouth/Throat:     Mouth: Mucous membranes are moist.     Pharynx: Oropharynx is clear. No oropharyngeal exudate or posterior oropharyngeal erythema.  Eyes:     General: No scleral icterus.       Right eye: No discharge.        Left eye: No discharge.     Extraocular Movements: Extraocular movements intact.     Conjunctiva/sclera: Conjunctivae normal.  Cardiovascular:     Rate and Rhythm: Normal rate and regular rhythm.  Pulses: Normal pulses.     Heart sounds: Normal heart sounds. No murmur heard.    No friction rub. No gallop.  Pulmonary:     Effort: Pulmonary effort is normal. No respiratory distress.     Breath sounds: Normal breath sounds. No stridor. No wheezing, rhonchi or rales.  Chest:     Chest wall: No tenderness.  Abdominal:     General: There is no distension.     Palpations: Abdomen is soft.     Tenderness: There is no abdominal tenderness. There is no right CVA tenderness, left CVA tenderness or guarding.   Musculoskeletal:        General: No swelling, tenderness, deformity or signs of injury.     Right lower leg: No edema.     Left lower leg: No edema.  Skin:    General: Skin is warm and dry.     Capillary Refill: Capillary refill takes less than 2 seconds.     Coloration: Skin is not jaundiced or pale.     Findings: No bruising, erythema or lesion.  Neurological:     Mental Status: He is alert and oriented to person, place, and time.     Motor: No weakness.     Coordination: Coordination normal.     Gait: Gait normal.  Psychiatric:        Mood and Affect: Mood normal.        Behavior: Behavior normal.        Thought Content: Thought content normal.        Judgment: Judgment normal.     No results found for any visits on 06/23/24.      Assessment & Plan:   Problem List Items Addressed This Visit       Respiratory   Acute URI - Primary   Compromised immune system increases infection risk. Occasional cough and high energy noted.  - Encouraged hydration. - Advised Mucinex  for cough. General health maintenance Discussed importance of flu vaccination due to sickle cell anemia. - Encouraged flu vaccine.       No orders of the defined types were placed in this encounter.   No follow-ups on file.  Desarai Barrack R Sylvia Helms, FNP  "

## 2024-06-26 ENCOUNTER — Other Ambulatory Visit: Payer: Self-pay | Admitting: Nurse Practitioner

## 2024-06-26 DIAGNOSIS — D571 Sickle-cell disease without crisis: Secondary | ICD-10-CM

## 2024-06-26 NOTE — Telephone Encounter (Signed)
 Please advise [facility]

## 2024-06-26 NOTE — Telephone Encounter (Unsigned)
 Copied from CRM #[id number]. Topic: Clinical - Medication Refill >> Jun 26, 2024 12:11 PM Zebedee SAUNDERS wrote: Medication: oxyCODONE -acetaminophen  (PERCOCET) 10-325 MG tablet  Has the patient contacted their pharmacy? Yes (Agent: If no, request that the patient contact the pharmacy for the refill. If patient does not wish to contact the pharmacy document the reason why and proceed with request.) (Agent: If yes, when and what did the pharmacy advise?)  This is the patient's preferred pharmacy:  [phone] [address], [place] - [address] [place] [address] Phone: [phone] Fax: [phone]  Is this the correct pharmacy for this prescription? Yes If no, delete pharmacy and type the correct one.   Has the prescription been filled recently? Yes  Is the patient out of the medication? Yes  Has the patient been seen for an appointment in the last year OR does the patient have an upcoming appointment? Yes  Can we respond through MyChart? Yes  Agent: Please be advised that Rx refills may take up to 3 business days. We ask that you follow-up with your pharmacy.

## 2024-06-27 MED ORDER — OXYCODONE-ACETAMINOPHEN 10-325 MG PO TABS: 1.0000 | ORAL_TABLET | Freq: Three times a day (TID) | ORAL | 0 refills | Status: DC | PRN

## 2024-07-06 ENCOUNTER — Ambulatory Visit (HOSPITAL_COMMUNITY)
Admission: RE | Admit: 2024-07-06 | Discharge: 2024-07-06 | Disposition: A | Discharge status: Home / Self Care | Source: Ambulatory Visit | Attending: Cardiology | Admitting: Cardiology

## 2024-07-06 DIAGNOSIS — R0789 Other chest pain: Secondary | ICD-10-CM | POA: Diagnosis not present

## 2024-07-07 LAB — ECHOCARDIOGRAM COMPLETE
Area-P 1/2: 3.06 cm2
S' Lateral: 3.7 cm

## 2024-07-10 ENCOUNTER — Other Ambulatory Visit: Payer: Self-pay

## 2024-07-10 DIAGNOSIS — D571 Sickle-cell disease without crisis: Secondary | ICD-10-CM

## 2024-07-10 NOTE — Telephone Encounter (Signed)
 Copied from CRM #[id number]. Topic: Clinical - Medication Refill >> Jul 10, 2024  2:32 PM Wess RAMAN wrote: Medication: oxyCODONE -acetaminophen  (PERCOCET) 10-325 MG tablet   Vitamin D , Ergocalciferol , (DRISDOL ) 1.25 MG (50000 UNIT) CAPS capsule   Has the patient contacted their pharmacy? Yes (Agent: If no, request that the patient contact the pharmacy for the refill. If patient does not wish to contact the pharmacy document the reason why and proceed with request.) (Agent: If yes, when and what did the pharmacy advise?) Contact doctor  This is the patient's preferred pharmacy:  [phone] [address], [place] - [address] [place] [address] Phone: [phone] Fax: [phone]  Is this the correct pharmacy for this prescription? Yes If no, delete pharmacy and type the correct one.   Has the prescription been filled recently? Yes  Is the patient out of the medication? Yes  Has the patient been seen for an appointment in the last year OR does the patient have an upcoming appointment? Yes  Can we respond through MyChart? Yes  Agent: Please be advised that Rx refills may take up to 3 business days. We ask that you follow-up with your pharmacy.  Please Advise.  CB.

## 2024-07-12 MED ORDER — OXYCODONE-ACETAMINOPHEN 10-325 MG PO TABS: 1.0000 | ORAL_TABLET | Freq: Three times a day (TID) | ORAL | 0 refills | Status: AC | PRN

## 2024-08-25 ENCOUNTER — Ambulatory Visit: Payer: Self-pay | Admitting: Nurse Practitioner
